# Patient Record
Sex: Male | Born: 1990 | Hispanic: No | Marital: Single | Smoking: Former smoker
Health system: Southern US, Community
[De-identification: ages and names within clinical notes are randomized; demographics above are authoritative.]

## PROBLEM LIST (undated history)

## (undated) DIAGNOSIS — F1910 Other psychoactive substance abuse, uncomplicated: Secondary | ICD-10-CM

## (undated) DIAGNOSIS — F419 Anxiety disorder, unspecified: Secondary | ICD-10-CM

## (undated) DIAGNOSIS — B1920 Unspecified viral hepatitis C without hepatic coma: Secondary | ICD-10-CM

## (undated) DIAGNOSIS — F329 Major depressive disorder, single episode, unspecified: Secondary | ICD-10-CM

## (undated) DIAGNOSIS — F32A Depression, unspecified: Secondary | ICD-10-CM

## (undated) HISTORY — PX: OTHER SURGICAL HISTORY: SHX169

---
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
## 2010-07-22 LAB — URINALYSIS, ROUTINE W REFLEX MICROSCOPIC
Bilirubin Urine: NEGATIVE
Glucose, UA: NEGATIVE mg/dL
Hgb urine dipstick: NEGATIVE
Ketones, ur: NEGATIVE mg/dL
Nitrite: NEGATIVE
Protein, ur: NEGATIVE mg/dL
Specific Gravity, Urine: 1.012 (ref 1.005–1.030)
Urobilinogen, UA: 1 mg/dL (ref 0.0–1.0)
pH: 8 (ref 5.0–8.0)

## 2010-08-27 NOTE — H&P (Signed)
[name], [name]                ACCOUNT NO.:  [id number]   MEDICAL RECORD NO.:  [id number]          PATIENT TYPE:  INP   LOCATION:  0203                          FACILITY:  [facility]   PHYSICIAN:  Lalla Brothers, MDDATE OF BIRTH:  06-26-1990   DATE OF ADMISSION:  09/28/2004  DATE OF DISCHARGE:                         PSYCHIATRIC ADMISSION ASSESSMENT   IDENTIFICATION:  This 20 year old male starting the ninth grade this fall at  [employer] is admitted emergently voluntarily in transfer  from [facility] pediatrics where it was inpatient  for stabilization of tachycardia associated with overdose suicide attempt.  The patient indicates a continued desire and intent to be dead to escape the  problems of his life, also indicating that he has substance abuse in this  regard too. He seems to identify with an aunt who he states became sober  from heroin when she knew she was going to die in order to die sober. Father  considers that the patient is disappointed in parental divorce when the  patient was age 4 and father's subsequent marriage. The patient has had  significant depression including reporting a 15-pound weight loss over the  last several months. The patient has diagnosed himself as bipolar disorder,  though he does not exhibit manic features. Still his reported of  conversation type auditory hallucinations is odd, particularly as responses  to these voices.   HISTORY OF PRESENT ILLNESS:  Containment for the patient is very difficult.  Father indicates the patient has outbursts of anger acting upon all these  problems as well as an intellectual sophistication being smarter than most  pharmacist about medications according to father. Father himself apparently  had substance abuse in the past but has 13 years of sobriety. Both parents  feel the patient is out of control, and they are having difficulty  facilitating the establishment of any  control. The patient is odd and  eccentric in his discussions and identifications. He has overdose 1 year ago  with [facility] as a suicide attempt. However, he also described using CCC,  snorting crushed Adderall, cannabis, alcohol and daily cigarettes to get  high and escape life. He was admitted to [facility] after reporting  overdosing with 30 white tablets he took from a friend's grandmother. He  suggests that these may be Xanax, though he also suggested there was a  mixture of anxiety and sleep medications. He also reported snorting crushed  Adderall before admission. He was concluded [facility] to have an  Adderall overdose due to his tachycardia evident on EKG as well. However,  his urine drug screen and his blood alcohol or negative even though he also  reported drinking alcohol with this overdose. The patient states that he is  in narcotic withdrawal and needs pain pills immediately. Father perceives  the patient is very depressed though the patient perceives himself as having  bipolar moods. The patient reports he has vague auditory hallucinations that  he answers, talking about what people sad and what music is on. The patient  has been suicidal now for the last  week. He has chronic disappointment and  dissatisfaction with life since parents divorced when he was age 27. He had  three psychotherapy sessions at [facility] in [place] or [place]. He is  on no medications of time of admission except for the Zantac and Bentyl  reportedly for irritable bowel.   PAST MEDICAL HISTORY:  The patient is under the primary care of [facility]. He reports a diagnosis of irritable bowel syndrome. He reports  that his appetite has been diminished several months and he has lost 15  pounds. He reports night sweats. He and father suggests that he had Rubella  in childhood though they may have meant chicken pox. His EKG was normal  except for sinus tachycardia at [facility]. His  random glucose was 111 and  repeated as it is CBG of 108. He had an elevated total bilirubin of 1.8. His  hemoglobin was high at 14.8 and MCHC at 35. He has no medication allergies.  He takes Zantac and Bentyl for irritable bowel symptoms though he may also  be describing dyspepsia or gastritis. He has no medication allergies. He had  no seizure or syncope. He had no heart murmur or arrhythmia.   REVIEW OF SYSTEMS:  The patient denies difficulty with gait, gaze or  continence. He denies exposure to communicable disease or toxins. He denies  rash, jaundice or purpura. There is no chest pain, palpitations or  presyncope. There is no abdominal pain, nausea, vomiting or diarrhea. There  is no dysuria or arthralgia.   IMMUNIZATIONS:  Up-to-date.   FAMILY HISTORY:  Father apparently had substance abuse disorder but is sober  for 13 years. The patient reports that an aunt was a heroin addict and  became sober just to die. Parents divorced when the patient was 3 years of  age, and father remarried a couple of years ago. Parents have joint custody  but neither feels like they can contain or help control the patient. They  have described the patient as very immature even though the patient thinks  himself as being overly mature.   SOCIAL AND DEVELOPMENTAL HISTORY:  The patient is starting the ninth grade  at [employer]. He has had academic performance problems in  the past. He was suspended twice last year for drug use. He acknowledges  using CCC, snorting crushed Adderall, using cannabis, using alcohol in the  daily use of cigarettes. He does not acknowledge sexual activity.   ASSETS:  The patient is intelligent.   MENTAL STATUS EXAM:  Height is 69 inches and weight is 124 pounds with blood  pressure 120/77, heart rate 134 sitting 123/75 with heart rate of 140  standing. He is left-handed. He is alert and oriented with speech intact. Cranial nerves II-XII intact. AMRs and  muscle strength and tone are normal.  Reflexes are normal, and there are no pathologic reflexes or soft neurologic  findings. Tandem gait and Romberg's are normal. Sensory exam is intact.  There are no abnormal involuntary movements. The patient seems to have  somewhat hostile dependent interpersonal style with schizotypal features. He  has severe dysphoria and seems hopeless, anorexic, has weight loss, and has  insomnia and fatigue. He does not exhibit manic symptoms. He reports vague  auditory hallucinations almost in a conversing style. He indicates that he  wants to be dead. He is morbidly fixated and seems to identify with aunt in  this way. He is asking for treatment for narcotic withdrawal an unusual  fashion. He expects his withdrawal to continue to get more painful even  though there are no physical manifestations of such on exam. He has  oppositional defiance and resistance. He is a significant suicide risk.   IMPRESSION:   AXIS I:  1.  Major depression, recurrent, severe.  2.  Oppositional defiant disorder.  3.  Psychoactive substance abuse not otherwise specified.  4.  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  5.  Parent-child problem.  6.  Other specified family circumstances.  7.  Noncompliance with treatment.   AXIS II:  Diagnosis deferred.   AXIS III:  1.  Mixed overdose apparently with Adderall and sedative/hypnotic.  2.  Irritable bowel syndrome and possible gastritis.  3.  Weight loss.   AXIS IV:  Stressors family severe acute and chronic; school moderate acute  and chronic; legal mild acute and chronic.   AXIS V:  Global assessment of function 30 with highest in last year 65.   PLAN:  The patient is admitted for inpatient adolescent psychiatric and  multidisciplinary multimodal behavioral health treatment in a team-based  programmatic locked psychiatric unit. The patient appears okay for Zantac  but will withhold Vistaril and  Bentyl considering his self-destructive and  addictive use of CCC. Remeron this started at 30 milligrams nightly.  Naltrexone may be helpful as well. Ongoing assessment is undertaken as  cognitive behavioral therapy starts. Anger management, communication and  social skills, substance abuse intervention, family therapy, identity  consolidation and object relations therapies can be undertaken. Estimated  length stay is 7 days with target symptoms for discharge being stabilization  of suicide risk and mood, stabilization of eccentric and somewhat psychotic  cognitions and perceptions and generalization of the capacity for safe  effective and sober participation in outpatient treatment.       GEJ/MEDQ  D:  09/29/2004  T:  09/29/2004  Job:  [id number]

## 2010-08-27 NOTE — Discharge Summary (Signed)
[name], [name]                ACCOUNT NO.:  [id number]   MEDICAL RECORD NO.:  [id number]          PATIENT TYPE:  INP   LOCATION:  0203                          FACILITY:  [facility]   PHYSICIAN:  Lalla Brothers, MDDATE OF BIRTH:  January 10, 1991   DATE OF ADMISSION:  09/28/2004  DATE OF DISCHARGE:  10/04/2004                                 DISCHARGE SUMMARY   IDENTIFICATION:  This 20 year-old male entering the ninth grade this fall at  [employer] was admitted emergently voluntarily in transfer  from [facility] pediatrics for inpatient  stabilization of tachycardia associated with overdose suicide attempt was  provided. The patient had continued intent to be dead and was fixated at the  time of arrival upon self-destructive activities such as addiction to  medication and various somatic concerns. The patient reported a 15-pound  weight loss over the last several months with significant depression that  father speculated with associated with parental divorce consequences. 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 For full details please see the typed admission assessment.   SYNOPSIS OF PRESENT ILLNESS:  The family notes outbursts of anger carried  out with an intellectual sophistication. Father has 13 years of sobriety and  thinks the patient is stressed by his remarriage. The patient is odd and  eccentric in his discussions identifications such as identifying with an  aunt, who died of consequences of heroin addiction but stopped using heroin  just before death. He was also concerned about needles that another aunt had  in her back for back problems. The patient had overdosed with sleeping  tablets he took from a friend's grandmother though he had also crushed  Adderall and snorted it. He acknowledged use of cannabis, alcohol  and  cigarettes to get high and escape life. He had three psychotherapy sessions  at [facility]. He was taking Zantac and Bentyl for 2 years for irritable bowel  under the primary care of [facility]. Parents have joint  custody. The patient thinks of himself as overly mature even though parents  perceived him as being very immature, particular currently. Father also  reported treatment with Wellbutrin himself in the past for depression with  mood swings. Paternal grandfather and paternal uncle had depression. There  is also a family history of diabetes and cancer as well as a paternal aunt  with seizures. The patient acknowledges some use of cocaine and triple C's  use and solicits Vistaril at the time of his arrival to the hospital.  He  uses Bentyl, triple C's and seeks other antihistamines as well.   INITIAL MENTAL STATUS EXAM:  The patient manifested hostile dependent  interpersonal style though with eccentric and odd communications and  identifications that seemed almost schizotypal. He had severe dysphoria and  seemed hopeless, irritable, anergic, and manifested insomnia and loss of  appetite. He did not a manifest manic diathesis. His vague auditory  hallucinations by history did not seem floridly psychotic  and seem likely to  represent a fusion of anxious and substance use symptoms. He reported that  he was in withdrawal from narcotics and needed immediate treatment coverage  for such. He was oppositional and defiant and presented significant suicide  risk.   LABORATORY FINDINGS:  At [facility], the patient's CBC was normal  except hemoglobin elevated at 14.8 with upper limit of normal 14.6 and MCHC  35 with upper limit of normal 34. White count was normal 6600, MCV at 86 and  platelet count 315,000 with  hematocrit 42.2. Monocyte differential was 10%  with upper limit of normal 9%. Comprehensive metabolic panel was normal  except random glucose 111 with total  bilirubin 1.8 with upper limit of  normal total bilirubin 1.2. Sodium was normal 140, potassium 3.7, CO2 25,  creatinine 0.8, calcium 9.3, albumin 4.3, AST 24 and ALT 15. Urine drug  screen at [facility] was negative despite the patient's reports of  overdose. Repeat capillary blood glucose was 108 just after midnight. At the  behavioral health center, free T4 was normal 1.01 and TSH at 2.484.  Urinalysis was normal with specific gravity slightly elevated at 1.031. RPR  was nonreactive. Urine probe for gonorrhea and chlamydia trichomatous by DNA  amplification were both negative. Electrocardiogram at [facility]  revealed sinus tachycardia with rate of 138, otherwise, normal with P-R of  132, QRS of 68 and QTc of 433 milliseconds.   HOSPITAL COURSE AND TREATMENT:  General medical exam by Mallie Darting PA-C  noted previous sutures and staples in his head and foot side in the past. He  reported the use of 5 or more cigarettes daily for the last 6 months. The  patient reported that grandmother, aunt and father had depression while  maternal grandmother and mother have anxiety. He had some acne that was  mild. He was Tanner stage IV. He reported a history of irritable bowel.  Admission height was 69 inches with weight of 124 pounds, and discharge  weight was 128 pounds. Blood pressure on admission was 120/77 with heart  rate of 134 sitting and 123/75 with heart rate of 140 standing. On the  second hospital day, supine blood pressure was 126/70 with heart rate of 87  and standing blood pressure 102 over 65 with heart rate of 153. Subsequently  his heart rate standing subsided to normal. By the time of discharge, supine  blood pressure was 103/59 with heart rate of 75 and standing blood pressure  100/55 with heart rate of 112. The patient was initially highly self-  defeating in treatment. He was started on Remeron 30 milligrams nightly and tolerated medication well. As anxiety  and depression gradually responded to  treatment, the patient's odd and eccentric identifications and symptoms  discussed remitted. He no longer talked about somatic fixations and fears or  identifications with drug addiction and demise. He was gradually able to  restore his desire to live. He improved communication with family and peers  and became a leader among peers in the treatment program. Coping skills  improved as did anger management and parents were pleased at the final  family therapy session. The patient required only several doses of Bentyl  during hospital stay p.r.n. with last dose on the 24th of June and only one  dose that day. He did receive his Zantac substitution throughout the  hospital stay. Both parents participated in the final family therapy  session. The patient made amends with the  family and plans for improved  communication, relations and activities in the future. He worked through  guilty ruminations that he had been storing up inside. He required no  seclusion or restraint during hospital stay. He participated in all  programmatic therapeutic activities including group, milieu, behavioral,  individual, family, special education, occupational, therapeutic  recreational, substance abuse intervention and anger management therapies.   FINAL DIAGNOSIS:   AXIS I:  1. Major depression, recurrent, severe.  2. Oppositional defiant disorder.  3. Generalized anxiety disorder.  4. Psychoactive substance abuse not otherwise specified.  5. Parent-child problem.  6. Other specified family circumstances.  7. Noncompliance with treatment.   AXIS II:  No diagnosis.   AXIS III:  1. Mixed overdose apparently with Adderall in sedative hypnotic.  2. Irritable bowel syndrome by history.  3. A 15-pound weight loss recently by history.  4. Cigarette smoking.   AXIS IV:  Stressors family severe, acute and chronic; school moderate acute  and chronic; legal mild acute and  chronic.   AXIS V:  Global assessment of function on admission 30 with highest in last  year 65 and discharge global assessment of function 56.   PLAN:  The patient was discharged to parents in improved condition free of  suicidal ideation. He was discharged on mirtazapine 30 milligrams every  bedtime, quantity #30 with one refill prescribed. They are educated on FDA  guidelines and on proper use and side effects. He continues his Zantac as  per own home supply, and they are  educated that Bentyl and triple C's are no longer necessary. He follows high-  fiber diet and has no restrictions on physical activity. Crisis and safety  plans are outlined if needed. He will see Terald Sleeper Ph.D. for therapy  October 06, 2004 at 1600. He will see Dr. Bayard Males for psychiatric follow-up  October 21, 2004 at 1700.       GEJ/MEDQ  D:  10/05/2004  T:  10/05/2004  Job:  [id number]   cc:   Bayard Males, MD  [facility]  [address] [place], [place]  EAV [id number] [phone]   Terald Sleeper, MD  [facility]  [address]  [place], [place]  NFA [phone]

## 2013-01-04 ENCOUNTER — Encounter (HOSPITAL_COMMUNITY): Payer: Self-pay | Admitting: Emergency Medicine

## 2013-01-04 ENCOUNTER — Inpatient Hospital Stay (HOSPITAL_COMMUNITY)
Admission: EM | Admit: 2013-01-04 | Discharge: 2013-01-11 | DRG: 897 | Disposition: A | Discharge status: Home / Self Care | Payer: 59 | Source: Intra-hospital | Attending: Psychiatry | Admitting: Psychiatry

## 2013-01-04 ENCOUNTER — Emergency Department (HOSPITAL_COMMUNITY)
Admission: EM | Admit: 2013-01-04 | Discharge: 2013-01-04 | Disposition: A | Discharge status: Other Acute Inpatient Hospital | Payer: 59 | Attending: Emergency Medicine | Admitting: Emergency Medicine

## 2013-01-04 ENCOUNTER — Encounter (HOSPITAL_COMMUNITY): Payer: Self-pay | Admitting: *Deleted

## 2013-01-04 DIAGNOSIS — Z5987 Material hardship due to limited financial resources, not elsewhere classified: Secondary | ICD-10-CM

## 2013-01-04 DIAGNOSIS — G4700 Insomnia, unspecified: Secondary | ICD-10-CM | POA: Insufficient documentation

## 2013-01-04 DIAGNOSIS — Z0289 Encounter for other administrative examinations: Secondary | ICD-10-CM | POA: Insufficient documentation

## 2013-01-04 DIAGNOSIS — F332 Major depressive disorder, recurrent severe without psychotic features: Secondary | ICD-10-CM

## 2013-01-04 DIAGNOSIS — F411 Generalized anxiety disorder: Secondary | ICD-10-CM

## 2013-01-04 DIAGNOSIS — F1910 Other psychoactive substance abuse, uncomplicated: Secondary | ICD-10-CM

## 2013-01-04 DIAGNOSIS — F1010 Alcohol abuse, uncomplicated: Secondary | ICD-10-CM | POA: Diagnosis present

## 2013-01-04 DIAGNOSIS — B1920 Unspecified viral hepatitis C without hepatic coma: Secondary | ICD-10-CM | POA: Diagnosis present

## 2013-01-04 DIAGNOSIS — F410 Panic disorder [episodic paroxysmal anxiety] without agoraphobia: Secondary | ICD-10-CM | POA: Diagnosis present

## 2013-01-04 DIAGNOSIS — F17200 Nicotine dependence, unspecified, uncomplicated: Secondary | ICD-10-CM | POA: Insufficient documentation

## 2013-01-04 DIAGNOSIS — F3289 Other specified depressive episodes: Secondary | ICD-10-CM | POA: Insufficient documentation

## 2013-01-04 DIAGNOSIS — R079 Chest pain, unspecified: Secondary | ICD-10-CM | POA: Insufficient documentation

## 2013-01-04 DIAGNOSIS — R45851 Suicidal ideations: Secondary | ICD-10-CM

## 2013-01-04 DIAGNOSIS — Z598 Other problems related to housing and economic circumstances: Secondary | ICD-10-CM

## 2013-01-04 DIAGNOSIS — F1020 Alcohol dependence, uncomplicated: Principal | ICD-10-CM | POA: Diagnosis present

## 2013-01-04 DIAGNOSIS — Z79899 Other long term (current) drug therapy: Secondary | ICD-10-CM

## 2013-01-04 DIAGNOSIS — F329 Major depressive disorder, single episode, unspecified: Secondary | ICD-10-CM

## 2013-01-04 DIAGNOSIS — F4310 Post-traumatic stress disorder, unspecified: Secondary | ICD-10-CM | POA: Diagnosis present

## 2013-01-04 HISTORY — DX: Major depressive disorder, single episode, unspecified: F32.9

## 2013-01-04 HISTORY — DX: Unspecified viral hepatitis C without hepatic coma: B19.20

## 2013-01-04 HISTORY — DX: Anxiety disorder, unspecified: F41.9

## 2013-01-04 HISTORY — DX: Depression, unspecified: F32.A

## 2013-01-04 LAB — COMPREHENSIVE METABOLIC PANEL
ALT: 23 U/L (ref 0–53)
Albumin: 3.6 g/dL (ref 3.5–5.2)
Alkaline Phosphatase: 46 U/L (ref 39–117)
BUN: 15 mg/dL (ref 6–23)
Potassium: 3.8 mEq/L (ref 3.5–5.1)
Sodium: 140 mEq/L (ref 135–145)
Total Protein: 6.5 g/dL (ref 6.0–8.3)

## 2013-01-04 LAB — RAPID URINE DRUG SCREEN, HOSP PERFORMED
Amphetamines: NOT DETECTED
Benzodiazepines: NOT DETECTED
Cocaine: POSITIVE — AB

## 2013-01-04 LAB — ETHANOL: Alcohol, Ethyl (B): <11 mg/dL (ref 0–11)

## 2013-01-04 LAB — CBC
MCHC: 34.9 g/dL (ref 30.0–36.0)
RDW: 12.4 % (ref 11.5–15.5)

## 2013-01-04 MED ORDER — BUSPIRONE HCL 5 MG PO TABS
10.0000 mg | ORAL_TABLET | Freq: Three times a day (TID) | ORAL | Status: DC
  Administered 2013-01-04 – 2013-01-11 (×21): 10 mg via ORAL
  Filled 2013-01-04 (×7): qty 1
  Filled 2013-01-04: qty 2
  Filled 2013-01-04 (×16): qty 1

## 2013-01-04 MED ORDER — HYDROXYZINE HCL 25 MG PO TABS
25.0000 mg | ORAL_TABLET | Freq: Four times a day (QID) | ORAL | Status: AC | PRN
  Administered 2013-01-06 – 2013-01-07 (×2): 25 mg via ORAL
  Filled 2013-01-04 (×2): qty 1

## 2013-01-04 MED ORDER — NICOTINE 21 MG/24HR TD PT24: 21.0000 mg | MEDICATED_PATCH | Freq: Every day | TRANSDERMAL | Status: DC

## 2013-01-04 MED ORDER — AMOXICILLIN-POT CLAVULANATE 875-125 MG PO TABS
1.0000 | ORAL_TABLET | Freq: Two times a day (BID) | ORAL | Status: AC
  Administered 2013-01-04 – 2013-01-08 (×8): 1 via ORAL
  Filled 2013-01-04 (×9): qty 1

## 2013-01-04 MED ORDER — TRAZODONE HCL 50 MG PO TABS
50.0000 mg | ORAL_TABLET | Freq: Every evening | ORAL | Status: DC | PRN
  Administered 2013-01-04 – 2013-01-06 (×3): 50 mg via ORAL
  Filled 2013-01-04 (×3): qty 1

## 2013-01-04 MED ORDER — PAROXETINE HCL 20 MG PO TABS
20.0000 mg | ORAL_TABLET | Freq: Every day | ORAL | Status: DC
  Administered 2013-01-04 – 2013-01-07 (×4): 20 mg via ORAL
  Filled 2013-01-04 (×6): qty 1

## 2013-01-04 MED ORDER — IBUPROFEN 200 MG PO TABS: 600.0000 mg | ORAL_TABLET | Freq: Three times a day (TID) | ORAL | Status: DC | PRN

## 2013-01-04 MED ORDER — ONDANSETRON 4 MG PO TBDP
4.0000 mg | ORAL_TABLET | Freq: Four times a day (QID) | ORAL | Status: DC | PRN
  Administered 2013-01-04: 4 mg via ORAL

## 2013-01-04 MED ORDER — CHLORDIAZEPOXIDE HCL 25 MG PO CAPS
25.0000 mg | ORAL_CAPSULE | Freq: Four times a day (QID) | ORAL | Status: AC
  Administered 2013-01-04 – 2013-01-05 (×6): 25 mg via ORAL
  Filled 2013-01-04 (×6): qty 1

## 2013-01-04 MED ORDER — ONDANSETRON HCL 4 MG PO TABS: 4.0000 mg | ORAL_TABLET | Freq: Three times a day (TID) | ORAL | Status: DC | PRN

## 2013-01-04 MED ORDER — CHLORDIAZEPOXIDE HCL 25 MG PO CAPS
25.0000 mg | ORAL_CAPSULE | Freq: Every day | ORAL | Status: AC
  Administered 2013-01-08: 25 mg via ORAL
  Filled 2013-01-04: qty 1

## 2013-01-04 MED ORDER — CHLORDIAZEPOXIDE HCL 25 MG PO CAPS
25.0000 mg | ORAL_CAPSULE | ORAL | Status: AC
  Administered 2013-01-07 (×2): 25 mg via ORAL
  Filled 2013-01-04 (×2): qty 1

## 2013-01-04 MED ORDER — ADULT MULTIVITAMIN W/MINERALS CH
1.0000 | ORAL_TABLET | Freq: Every day | ORAL | Status: DC
  Administered 2013-01-04 – 2013-01-11 (×8): 1 via ORAL
  Filled 2013-01-04 (×9): qty 1

## 2013-01-04 MED ORDER — CHLORDIAZEPOXIDE HCL 25 MG PO CAPS
25.0000 mg | ORAL_CAPSULE | Freq: Four times a day (QID) | ORAL | Status: AC | PRN
  Administered 2013-01-04 – 2013-01-06 (×4): 25 mg via ORAL
  Filled 2013-01-04 (×4): qty 1

## 2013-01-04 MED ORDER — ALUM & MAG HYDROXIDE-SIMETH 200-200-20 MG/5ML PO SUSP: 30.0000 mL | ORAL | Status: DC | PRN

## 2013-01-04 MED ORDER — MAGNESIUM HYDROXIDE 400 MG/5ML PO SUSP: 30.0000 mL | Freq: Every day | ORAL | Status: DC | PRN

## 2013-01-04 MED ORDER — ACETAMINOPHEN 325 MG PO TABS: 650.0000 mg | ORAL_TABLET | Freq: Four times a day (QID) | ORAL | Status: DC | PRN

## 2013-01-04 MED ORDER — LOPERAMIDE HCL 2 MG PO CAPS: 2.0000 mg | ORAL_CAPSULE | ORAL | Status: AC | PRN

## 2013-01-04 MED ORDER — LORAZEPAM 1 MG PO TABS: 1.0000 mg | ORAL_TABLET | Freq: Three times a day (TID) | ORAL | Status: DC | PRN

## 2013-01-04 MED ORDER — VITAMIN B-1 100 MG PO TABS
100.0000 mg | ORAL_TABLET | Freq: Every day | ORAL | Status: DC
  Administered 2013-01-05 – 2013-01-11 (×7): 100 mg via ORAL
  Filled 2013-01-04 (×8): qty 1

## 2013-01-04 MED ORDER — CHLORDIAZEPOXIDE HCL 25 MG PO CAPS
25.0000 mg | ORAL_CAPSULE | Freq: Three times a day (TID) | ORAL | Status: AC
  Administered 2013-01-06 (×3): 25 mg via ORAL
  Filled 2013-01-04 (×3): qty 1
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
## 2013-01-04 NOTE — ED Notes (Signed)
Pt transferred from triage, presents with SI, plan to drink bleach.  States he has no health insurance and his appt was pushed back 1 month, felt SI.  Reports he took pills 8 yrs ago.  Denies HI or AV hallucinations, diag. With Anxiety,Panic DO.  Admits to drinking 8 beers/day, cocaine & marijuana use.  Feeling hopeless.  Pt calm & cooperative.

## 2013-01-04 NOTE — ED Provider Notes (Signed)
Medical screening examination/treatment/procedure(s) were performed by non-physician practitioner and as supervising physician I was immediately available for consultation/collaboration.    Celene Kras, MD 01/04/13 [phone]

## 2013-01-04 NOTE — BHH Group Notes (Signed)
Adult Psychoeducational Group Note  Date:  01/04/2013 Time:  10:02 PM  Group Topic/Focus:  AA Meeting  Participation Level:  Active  Participation Quality:  Appropriate  Affect:  Appropriate  Cognitive:  Appropriate  Insight: Appropriate  Engagement in Group:  Engaged  Modes of Intervention:  Discussion and Education  Additional Comments:  Kamali attended group.  Caroll Rancher A 01/04/2013, 10:02 PM
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
## 2013-01-04 NOTE — ED Notes (Signed)
Pt states for the past couple of months he has had thoughts of hurting himself but over the past week it has gotten worse to the point that it is scaring him   Pt states he called [facility] and was told to come here  Pt states he has tried to get help on his own but because he does not have medical insurance no one will see him

## 2013-01-04 NOTE — Tx Team (Signed)
Initial Interdisciplinary Treatment Plan  PATIENT STRENGTHS: (choose at least two) Ability for insight Active sense of humor Average or above average intelligence Capable of independent living Communication skills General fund of knowledge  PATIENT STRESSORS: Educational concerns Financial difficulties Health problems Marital or family conflict Substance abuse   PROBLEM LIST: Problem List/Patient Goals Date to be addressed Date deferred Reason deferred Estimated date of resolution  Alcohol Abuse 01/04/13     depression 01/04/13      Generalized Anxiety 01/04/13     Tonsil nfection 01/04/13                                    DISCHARGE CRITERIA:  Ability to meet basic life and health needs Adequate post-discharge living arrangements Improved stabilization in mood, thinking, and/or behavior Medical problems require only outpatient monitoring Motivation to continue treatment in a less acute level of care Need for constant or close observation no longer present  PRELIMINARY DISCHARGE PLAN: Attend aftercare/continuing care group Attend PHP/IOP Attend 12-step recovery group Outpatient therapy  PATIENT/FAMIILY INVOLVEMENT: Rich Brave 01/04/2013, 5:09 PM

## 2013-01-04 NOTE — ED Notes (Signed)
Pt has a suicide attempt in the past at age 22

## 2013-01-04 NOTE — ED Provider Notes (Addendum)
CSN: [id number]     Arrival date & time 01/04/13  0028 History   First MD Initiated Contact with Patient 01/04/13 0103     Chief Complaint  Patient presents with  . Medical Clearance   (Consider location/radiation/quality/duration/timing/severity/associated sxs/prior Treatment) HPI Comments: This and states, that he had a suicide attempt at age 22.  He was sent to a residential pediatric psychiatric facility for a week or 2 discharged on medication, which he took for another week or 2.  Didn't like the way.  It makes felt so.  He hasn't taken any medication.  Since that this is been tolerating his wife until about 4 months ago, when something changed any of his daily.  Severe anxiety is not sleeping.  He is losing weight.  He's resorted to alcohol use on a daily basis.  Occasional marijuana, when he is in social situations.  He denies suicidality, but he states, that he would rather go to sleep, and never wake up  The history is provided by the patient.    Past Medical History  Diagnosis Date  . Hepatitis C   . Depression   . Anxiety    Past Surgical History  Procedure Laterality Date  . Extraction of wisdom teeth     History reviewed. No pertinent family history. History  Substance Use Topics  . Smoking status: Current Every Day Smoker -- 1.00 packs/day    Types: Cigarettes  . Smokeless tobacco: Not on file  . Alcohol Use: 3.6 oz/week    6 Glasses of wine, 7-10 Cans of beer per week     Comment: 4 to 5 times a week     Review of Systems  Constitutional: Negative for fever.  HENT: Negative for rhinorrhea.   Respiratory: Negative for shortness of breath.   Cardiovascular: Positive for chest pain.  Neurological: Negative for dizziness and headaches.  Psychiatric/Behavioral: Positive for sleep disturbance. Negative for suicidal ideas and self-injury. The patient is nervous/anxious.   All other systems reviewed and are negative.    Allergies  Review of patient's allergies  indicates no known allergies.  Home Medications  No current outpatient prescriptions on file. BP 114/77  Pulse 79  Temp(Src) 98.6 F (37 C) (Oral)  Resp 16  Ht 6' (1.829 m)  Wt 127 lb (57.607 kg)  BMI 17.22 kg/m2  SpO2 100% Physical Exam  Nursing note and vitals reviewed. Constitutional: He appears well-developed and well-nourished.  HENT:  Head: Normocephalic.  Eyes: Pupils are equal, round, and reactive to light.  Cardiovascular: Normal rate and regular rhythm.   Pulmonary/Chest: Effort normal.  Musculoskeletal: Normal range of motion.  Neurological: He is alert.  Skin: Skin is warm and dry. No rash noted.  Psychiatric: His speech is normal. His mood appears anxious. Cognition and memory are normal. He expresses inappropriate judgment. He exhibits a depressed mood. He expresses suicidal ideation. He expresses no suicidal plans.    ED Course  Procedures (including critical care time) Labs Review Labs Reviewed  URINE RAPID DRUG SCREEN (HOSP PERFORMED) - Abnormal; Notable for the following:    Cocaine POSITIVE (*)    Tetrahydrocannabinol POSITIVE (*)    All other components within normal limits  CBC  COMPREHENSIVE METABOLIC PANEL  ETHANOL   Imaging Review No results found.  MDM   1. Depression   2. Substance abuse     Will have patient evaluated by psychiatry Labs reviewed all within normal parameters.  Patient.  Stretching is positive for cocaine, and marijuana  Arman Filter, NP 01/04/13 0129  Arman Filter, NP 01/04/13 2049

## 2013-01-04 NOTE — ED Notes (Addendum)
Pt is awake and alert, pleasant and cooperative. Patient denies HI, SI AH or VH.pt is reporting passive SI. Discharge vitals 115/78 HR 77 RR 16 and unlabored. Pt has inpatient treatment scheduled at [facility]. Will continue to monitor for safety. Patient escorted to lobby without incident. T.Melvyn Neth RN
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
## 2013-01-04 NOTE — ED Provider Notes (Signed)
Accepted at [facility], patient agrees to transfer  Wayne Horn, MD 01/05/13 [phone]

## 2013-01-05 ENCOUNTER — Encounter (HOSPITAL_COMMUNITY): Payer: Self-pay | Admitting: Physician Assistant

## 2013-01-05 DIAGNOSIS — F1910 Other psychoactive substance abuse, uncomplicated: Secondary | ICD-10-CM

## 2013-01-05 DIAGNOSIS — F332 Major depressive disorder, recurrent severe without psychotic features: Secondary | ICD-10-CM

## 2013-01-05 DIAGNOSIS — F1010 Alcohol abuse, uncomplicated: Secondary | ICD-10-CM

## 2013-01-05 MED ORDER — NICOTINE 14 MG/24HR TD PT24
14.0000 mg | MEDICATED_PATCH | Freq: Every day | TRANSDERMAL | Status: DC
  Administered 2013-01-05 – 2013-01-11 (×7): 14 mg via TRANSDERMAL
  Filled 2013-01-05 (×9): qty 1

## 2013-01-05 MED ORDER — MIRTAZAPINE 15 MG PO TABS
15.0000 mg | ORAL_TABLET | Freq: Every day | ORAL | Status: DC
  Administered 2013-01-05: 15 mg via ORAL
  Filled 2013-01-05 (×3): qty 1

## 2013-01-05 MED ORDER — PHENAZOPYRIDINE HCL 200 MG PO TABS
200.0000 mg | ORAL_TABLET | Freq: Three times a day (TID) | ORAL | Status: AC
Start: 2013-01-05 — End: 2013-01-07
  Administered 2013-01-05 – 2013-01-07 (×6): 200 mg via ORAL
  Filled 2013-01-05 (×6): qty 1

## 2013-01-05 NOTE — Progress Notes (Signed)
Patient ID: [name], male   DOB: 1990/05/21, 22 y.o.   MRN: [id number] D)  Attended group this evening, stated he had never been to an AA meeting and had enjoyed it, was interested in what was said and was learning.  Stated was having some nausea and anxiety, went back to his room and spent most of the rest of the evening in his room reading.  Has been pleasant and cooperative, compliant with meds, program.  A)  Will continue to monitor for safety, continue POC R)  Safety maintained.

## 2013-01-05 NOTE — [facility] Group Notes (Signed)
[facility] Group Notes:  (Nursing/MHT/Case Management/Adjunct)  Date:  01/05/2013  Time:  3:27 PM  Type of Therapy:  Psychoeducational Skills  Participation Level:  Active  Participation Quality:  Appropriate  Affect:  Appropriate  Cognitive:  Appropriate  Insight:  Appropriate  Engagement in Group:  Engaged  Modes of Intervention:  Problem-solving  Summary of Progress/Problems: Pt attended healthy coping skills group, and was able to engage in treatment.   Jacquelyne Balint Shanta 01/05/2013, 3:27 PM

## 2013-01-05 NOTE — [facility] Group Notes (Signed)
[facility] Group Notes: (Clinical Social Work)   01/05/2013      Type of Therapy:  Group Therapy   Participation Level:  Did Not Attend - He did arrive for last 5 minutes of group and identified himself as being in the Preparation Stage of Change.  Stated he is preparing first by being here at [facility].  Then he wants to find a different place to live.   Ambrose Mantle, LCSW 01/05/2013, 1:10 PM
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
## 2013-01-05 NOTE — Progress Notes (Signed)
Psychoeducational Group Note  Date:  01/05/2013 Time: 1300  Group Topic/Focus:  Identifying Needs:   The focus of this group is to help patients identify their personal needs that have been historically problematic and identify healthy behaviors to address their needs.  Participation Level:  Active  Participation Quality:  Appropriate  Affect:  Appropriate  Cognitive:  Oriented  Insight:  Limited  Engagement in Group:  Engaged  Additional Comments:    01/05/2013,4:40 PM Analysa Nutting, Joie Bimler
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
## 2013-01-05 NOTE — Progress Notes (Signed)
D Kelsey has continued with his alcohol detox today, tolerating it well. HE has drank ample amounts of gatorade. HE tolerates his medications well and he has a much better appetite today, sharing that he has actually eaten "most " of his lunch and dinner today.   A He was started on pyridium, for c/o burning and urgency on urination. He tolerates this new medication without diff. HE he has been ordered remeron, for bedtime. HE is engaged in his poc and is actively trying to learn healthier coping skills.
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
## 2013-01-06 MED ORDER — ONDANSETRON HCL 4 MG PO TABS
4.0000 mg | ORAL_TABLET | Freq: Four times a day (QID) | ORAL | Status: AC | PRN
  Administered 2013-01-06: 4 mg via ORAL
  Filled 2013-01-06: qty 1

## 2013-01-06 MED ORDER — ENSURE COMPLETE PO LIQD
237.0000 mL | Freq: Three times a day (TID) | ORAL | Status: DC
  Administered 2013-01-06 – 2013-01-11 (×15): 237 mL via ORAL

## 2013-01-06 MED ORDER — MIRTAZAPINE 30 MG PO TABS
30.0000 mg | ORAL_TABLET | Freq: Every day | ORAL | Status: DC
  Administered 2013-01-06: 30 mg via ORAL
  Filled 2013-01-06 (×2): qty 1

## 2013-01-06 NOTE — Progress Notes (Signed)
[facility] MD Progress Note  01/06/2013 11:20 AM [name]  MRN:  [id number] Subjective:  [name] is lying in bed this morning, and does not open his eyes or move to speak with this provider. He reports that he did not sleep well last night although he took both Remeron and trazodone. He endorses cravings for alcohol, and states he is having mild tremors. He rates his depression as a 7 on the scale of 1-10 where 10 is the worst. He rates his anxiety as an 8/10. He denies any suicidal or homicidal ideations. He denies any auditory or visual hallucinations. 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  Hepatitis C    .  Depression    .  Anxiety    AXIS IV: economic problems, educational problems, housing problems, occupational problems, other psychosocial or environmental problems, problems related to legal system/crime, problems related to social environment and problems with primary support group  AXIS V: 21-30 behavior considerably influenced by delusions or hallucinations OR serious impairment in judgment, communication OR inability to function in almost all areas  ADL's:  Intact  Sleep: Poor  Appetite:  Good  Suicidal Ideation:  Patient denies any thoughts, plan, or intent Homicidal Ideation:  Patient denies any thoughts, plan, or intent AEB (as evidenced by):  Psychiatric Specialty Exam: Review of Systems  Constitutional: Negative.   HENT: Negative.   Eyes: Negative.    Respiratory: Negative.   Cardiovascular: Negative.   Gastrointestinal: Negative.   Musculoskeletal: Negative.   Skin: Negative.   Neurological: Positive for tremors.  Endo/Heme/Allergies: Negative.   Psychiatric/Behavioral: Positive for depression and substance abuse. Negative for suicidal ideas and hallucinations. The patient is nervous/anxious and has insomnia.     Blood pressure 126/82, pulse 103, temperature 97.6 F (36.4 C), temperature source Oral, resp. rate 22, height 6\' 6"  (1.981 m), weight 57.153 kg (126 lb), SpO2 97.00%.Body mass index is 14.56 kg/(m^2).  General Appearance: Disheveled  Eye Contact::  Absent  Speech:  Clear and Coherent  Volume:  Decreased  Mood:  Depressed  Affect:  Congruent  Thought Process:  Logical  Orientation:  Full (Time, Place, and Person)  Thought Content:  WDL  Suicidal Thoughts:  No  Homicidal Thoughts:  No  Memory:  Immediate;   Fair Recent;   Fair Remote;   Fair  Judgement:  Impaired  Insight:  Lacking  Psychomotor Activity:  Decreased  Concentration:  Good  Recall:  Good  Akathisia:  No  Handed:    AIMS (if indicated):     Assets:  Communication Skills Desire for Improvement  Sleep:  Number of Hours: 5   Current Medications: Current Facility-Administered Medications  Medication Dose Route Frequency Provider Last Rate Last Dose  . acetaminophen (TYLENOL) tablet 650 mg  650 mg Oral Q6H PRN Earney Navy, NP      . alum & mag hydroxide-simeth (MAALOX/MYLANTA) 200-200-20 MG/5ML suspension 30 mL  30 mL Oral Q4H PRN Earney Navy, NP      . amoxicillin-clavulanate (AUGMENTIN) 875-125 MG per tablet 1 tablet  1  tablet Oral Q12H Verne Spurr, PA-C   1 tablet at 01/06/13 4098  . busPIRone (BUSPAR) tablet 10 mg  10 mg Oral TID Earney Navy, NP   10 mg at 01/06/13 0752  . chlordiazePOXIDE (LIBRIUM) capsule 25 mg  25 mg Oral Q6H PRN Fransisca Kaufmann, NP   25 mg at 01/05/13 1029  . chlordiazePOXIDE (LIBRIUM) capsule 25 mg  25 mg  Oral TID Fransisca Kaufmann, NP   25 mg at 01/06/13 0752   Followed by  . [START ON 01/07/2013] chlordiazePOXIDE (LIBRIUM) capsule 25 mg  25 mg Oral BH-qamhs Fransisca Kaufmann, NP       Followed by  . [START ON 01/08/2013] chlordiazePOXIDE (LIBRIUM) capsule 25 mg  25 mg Oral Daily Fransisca Kaufmann, NP      . hydrOXYzine (ATARAX/VISTARIL) tablet 25 mg  25 mg Oral Q6H PRN Fransisca Kaufmann, NP      . loperamide (IMODIUM) capsule 2-4 mg  2-4 mg Oral PRN Fransisca Kaufmann, NP      . magnesium hydroxide (MILK OF MAGNESIA) suspension 30 mL  30 mL Oral Daily PRN Earney Navy, NP      . mirtazapine (REMERON) tablet 15 mg  15 mg Oral QHS Jorje Guild, PA-C   15 mg at 01/05/13 2200  . multivitamin with minerals tablet 1 tablet  1 tablet Oral Daily Fransisca Kaufmann, NP   1 tablet at 01/06/13 0752  . nicotine (NICODERM CQ - dosed in mg/24 hours) patch 14 mg  14 mg Transdermal Q0600 Rachael Fee, MD   14 mg at 01/06/13 0751  . ondansetron (ZOFRAN) tablet 4 mg  4 mg Oral Q6H PRN Rachael Fee, MD      . PARoxetine (PAXIL) tablet 20 mg  20 mg Oral Daily Earney Navy, NP   20 mg at 01/06/13 0752  . phenazopyridine (PYRIDIUM) tablet 200 mg  200 mg Oral TID WC Jorje Guild, PA-C   200 mg at 01/06/13 0752  . thiamine (VITAMIN B-1) tablet 100 mg  100 mg Oral Daily Fransisca Kaufmann, NP   100 mg at 01/06/13 0752  . traZODone (DESYREL) tablet 50 mg  50 mg Oral QHS PRN,MR X 1 Earney Navy, NP   50 mg at 01/05/13 2200    Lab Results: No results found for this or any previous visit (from the past 48 hour(s)).  Physical Findings: AIMS: Facial and Oral Movements Muscles of Facial Expression: None, normal Lips and Perioral Area: None, normal Jaw: None, normal Tongue: None, normal,Extremity Movements Upper (arms, wrists, hands, fingers): None, normal Lower (legs, knees, ankles, toes): None, normal, Trunk Movements Neck, shoulders, hips: None, normal, Overall Severity Severity of abnormal movements (highest score from questions above): None,  normal Incapacitation due to abnormal movements: None, normal Patient's awareness of abnormal movements (rate only patient's report): No Awareness, Dental Status Current problems with teeth and/or dentures?: No Does patient usually wear dentures?: No  CIWA:  CIWA-Ar Total: 4 COWS:  COWS Total Score: 7  Treatment Plan Summary: Daily contact with patient to assess and evaluate symptoms and progress in treatment Medication management  Plan: We will increase his Remeron to 30 mg. Otherwise we will continue the safe medical detox, and research options for followup care. Medical Decision Making Problem Points:  Established problem, stable/improving (1) and Review of psycho-social stressors (1) Data Points:  Review or order clinical lab tests (1) Review of new medications or change in dosage (2)  I certify that inpatient services furnished can reasonably  be expected to improve the patient's condition.   WATT,ALAN 01/06/2013, 11:20 AM  Discussed with provider. Reviewed note. Agree with above findings, assessment and plan.   Jacqulyn Cane, M.D.  01/06/2013 11:41 PM

## 2013-01-06 NOTE — [facility] Group Notes (Signed)
[facility] Group Notes: (Clinical Social Work)   01/06/2013      Type of Therapy:  Group Therapy   Participation Level:  Did Not Attend    Ambrose Mantle, LCSW 01/06/2013, 12:39 PM

## 2013-01-06 NOTE — Progress Notes (Signed)
Nutrition Brief Note Patient identified on the Malnutrition Screening Tool (MST) Report.  Wt Readings from Last 10 Encounters:  01/04/13 126 lb (57.153 kg)  01/04/13 127 lb (57.607 kg)   Body Mass Index is 17.3 Patient meets criteria for Underweight based on current BMI.   Discussed intake PTA with patient and compared to intake presently.  Discussed changes in intake, if any, and encouraged adequate intake of meals and snacks. Current diet order is regular and pt is also offered choice of unit snacks mid-morning and mid-afternoon.  Pt is eating as desired.   Labs and medications reviewed.   Pt reports that he does not eat well because he has severe anxiety that makes him sick to his stomach and unable to eat. Pt has experienced "at least a 30 lb wt loss."   Nutrition Dx:  Unintended wt change r/t suboptimal oral intake AEB pt report  Interventions:   Discussed the importance of nutrition and encouraged intake of food and beverages.     Encouraged small frequent high-calorie, high-protein meals.   Discussed weight goals with patient.   Supplements: Ensure Complete po TID, each supplement provides 350 kcal and 13 grams of protein.    No additional nutrition interventions warranted at this time. If nutrition issues arise, please consult RD.   Ebbie Latus RD, LDN

## 2013-01-06 NOTE — Progress Notes (Signed)
Patient ID: [name], male   DOB: [birth year], 22 y.o.   MRN: [id number] D)  Came to the med window this evening, stated still feels anxious frequently, feels like heart is skipping beats sometimes, and feels SOB when this happens, which adds to his anxiety.  States had an EKG for a facility and was told it was abnormal, but was never told what the abnormality was. States doesn't have insurance, so he doesn't think he can have himself evaluated.  Wondered if it was something that could be arranged, encouraged him to talk with MD.  Has been attending group, pleasant, compliant, feeling better in regard to w/d sx.  A)  Will continue to monitor for safety, continue POC R)  Safety maintained.

## 2013-01-06 NOTE — Progress Notes (Signed)
Patient ID: [name], male   DOB: [birth year], 22 y.o.   MRN: [id number] D)  Came to the med window this evening, states feeling a little better, throat still sore, a little tired, but feels is doing better.  States continues to have anxiety, and episodes where he feels his heart is racing, HR had gone into 150's with anxiety episode earlier today. Attended group tonight, co,pliant with meds, pleasant, cooperative.  A)  Will continue to monitor for safety, continue POC R)  Safety maintained.

## 2013-01-07 DIAGNOSIS — F4310 Post-traumatic stress disorder, unspecified: Secondary | ICD-10-CM | POA: Diagnosis present

## 2013-01-07 DIAGNOSIS — F1010 Alcohol abuse, uncomplicated: Secondary | ICD-10-CM | POA: Diagnosis present

## 2013-01-07 DIAGNOSIS — F410 Panic disorder [episodic paroxysmal anxiety] without agoraphobia: Secondary | ICD-10-CM | POA: Diagnosis present

## 2013-01-07 DIAGNOSIS — F39 Unspecified mood [affective] disorder: Secondary | ICD-10-CM

## 2013-01-07 MED ORDER — PROPRANOLOL HCL 10 MG PO TABS
10.0000 mg | ORAL_TABLET | Freq: Two times a day (BID) | ORAL | Status: DC
  Filled 2013-01-07 (×2): qty 1

## 2013-01-07 MED ORDER — PROPRANOLOL HCL 10 MG PO TABS
10.0000 mg | ORAL_TABLET | Freq: Two times a day (BID) | ORAL | Status: DC
  Administered 2013-01-07 – 2013-01-11 (×8): 10 mg via ORAL
  Filled 2013-01-07 (×12): qty 1

## 2013-01-07 MED ORDER — GABAPENTIN 100 MG PO CAPS
100.0000 mg | ORAL_CAPSULE | Freq: Once | ORAL | Status: AC
  Administered 2013-01-07: 100 mg via ORAL
  Filled 2013-01-07: qty 1

## 2013-01-07 MED ORDER — ZOLPIDEM TARTRATE 10 MG PO TABS
10.0000 mg | ORAL_TABLET | Freq: Every evening | ORAL | Status: DC | PRN
  Administered 2013-01-07: 10 mg via ORAL
  Filled 2013-01-07 (×2): qty 1

## 2013-01-07 MED ORDER — GABAPENTIN 100 MG PO CAPS
200.0000 mg | ORAL_CAPSULE | Freq: Three times a day (TID) | ORAL | Status: DC
  Administered 2013-01-07 – 2013-01-08 (×2): 200 mg via ORAL
  Filled 2013-01-07 (×5): qty 2

## 2013-01-07 MED ORDER — HYDROXYZINE HCL 50 MG PO TABS: 50.0000 mg | ORAL_TABLET | ORAL | Status: DC | PRN

## 2013-01-07 MED ORDER — GABAPENTIN 100 MG PO CAPS
100.0000 mg | ORAL_CAPSULE | Freq: Three times a day (TID) | ORAL | Status: DC
  Filled 2013-01-07: qty 1

## 2013-01-07 MED ORDER — GABAPENTIN 100 MG PO CAPS
100.0000 mg | ORAL_CAPSULE | Freq: Three times a day (TID) | ORAL | Status: DC
  Administered 2013-01-07: 100 mg via ORAL
  Filled 2013-01-07 (×7): qty 1

## 2013-01-07 MED ORDER — HYDROXYZINE HCL 50 MG PO TABS
50.0000 mg | ORAL_TABLET | Freq: Once | ORAL | Status: AC
  Administered 2013-01-07: 50 mg via ORAL
  Filled 2013-01-07 (×2): qty 1

## 2013-01-07 NOTE — Progress Notes (Signed)
D:Pt's pulse has been increased during the day. Pt reports heart racing, throat tightness, and not being able to sit still. Reported to NP and pt was evaluated. New orders were written. Pt reports that these symptoms happen at home with increased anxiety and no probable cause. A:Offered support, encouragement and 15 minute checks. R:Pt denies si and hi. Pt is using deep breathing to decrease anxiety. Safety maintained on the unit.

## 2013-01-07 NOTE — Tx Team (Signed)
Interdisciplinary Treatment Plan Update (Adult)  Date: 01/07/2013  Time Reviewed:  9:45 AM  Progress in Treatment: Attending groups: Yes Participating in groups:  Yes Taking medication as prescribed:  Yes Tolerating medication:  Yes Family/Significant othe contact made: CSW assessing Patient understands diagnosis:  Yes Discussing patient identified problems/goals with staff:  Yes Medical problems stabilized or resolved:  Yes Denies suicidal/homicidal ideation: Yes Issues/concerns per patient self-inventory:  Yes Other:  New problem(s) identified: N/A  Discharge Plan or Barriers: CSW assessing for appropriate referrals.    Reason for Continuation of Hospitalization: Anxiety Depression Detox Medication Stabilization  Comments: N/A  Estimated length of stay: 3-5 days  For review of initial/current patient goals, please see plan of care.  Attendees: Patient:     Family:     Physician:  Dr. Lugo 01/07/2013 10:46 AM   Nursing:   Andrea Thorne, RN 01/07/2013 10:46 AM   Clinical Social Worker:  Rynlee Lisbon Horton, LCSWA 01/07/2013 10:46 AM   Other: Jennifer Clark, RN case manager 01/07/2013 10:46 AM   Other:  Heather Smart, LCSWA 01/07/2013 10:46 AM   Other:  Aggie Nwoko, NP 01/07/2013 10:46 AM   Other:     Other:    Other:    Other:    Other:    Other:    Other:     Scribe for Treatment Team:   Horton, Arshia Rondon Nicole, 01/07/2013 , 10:46 AM     

## 2013-01-07 NOTE — ED Provider Notes (Signed)
Medical screening examination/treatment/procedure(s) were performed by non-physician practitioner and as supervising physician I was immediately available for consultation/collaboration.    Aminat Shelburne R Breon Diss, MD 01/07/13 1018 

## 2013-01-07 NOTE — Progress Notes (Addendum)
[facility] MD Progress Note  01/07/2013 5:57 PM [name]  MRN:  [id number] Subjective:  States he experiences severe anxiety, panic attacks. States that the anxiety has caused his jobs, as well as kept him form being able to go back  To school. States he had a problem with opioids but endorsed he is not anymore. Any substance use at this time states is an attempt to deal with hs anxiety. It got to a point he was wanting to kills himself. States he stays at home has no life. Diagnosis:  Sates that since he was started on the Paxil he feels more agitated, having restless legs DSM5: Schizophrenia Disorders:   Obsessive-Compulsive Disorders:   Trauma-Stressor Disorders:  Posttraumatic Stress Disorder (309.81) Substance/Addictive Disorders:  Alcohol Use Disoreder Depressive Disorders:  Major Depressive Disorder - Moderate (296.22)  Axis I: Mood Disorder NOS and Panic Disorder  ADL's:  Intact  Sleep: Poor  Appetite:  Poor  Suicidal Ideation:  Plan:  denies Intent:  denies Means:  denies Homicidal Ideation:  Plan:  denies Intent:  denies Means:  denies AEB (as evidenced by):  Psychiatric Specialty Exam: Review of Systems  Constitutional: Negative.   HENT: Negative.   Eyes: Negative.   Respiratory: Negative.   Cardiovascular: Negative.   Gastrointestinal: Negative.   Genitourinary: Negative.   Musculoskeletal: Negative.   Skin: Negative.   Neurological: Negative.   Endo/Heme/Allergies: Negative.   Psychiatric/Behavioral: Positive for depression, suicidal ideas and substance abuse. The patient is nervous/anxious and has insomnia.     Blood pressure 114/79, pulse 134, temperature 97.3 F (36.3 C), temperature source Oral, resp. rate 20, height 6\' 6"  (1.981 m), weight 57.153 kg (126 lb), SpO2 97.00%.Body mass index is 14.56 kg/(m^2).  General Appearance: Fairly Groomed  Patent attorney::  Fair  Speech:  Clear and Coherent and Pressured  Volume:  fluctuates  Mood:  Anxious, Depressed  and overwhelmed  Affect:  anxious  Thought Process:  Coherent and Goal Directed  Orientation:  Full (Time, Place, and Person)  Thought Content:  worries, concerns, ruminations  Suicidal Thoughts:  Yes.  without intent/plan  Homicidal Thoughts:  No  Memory:  Immediate;   Fair Recent;   Fair Remote;   Fair  Judgement:  Fair  Insight:  Present and superficial  Psychomotor Activity:  Restlessness  Concentration:  Fair  Recall:  Fair  Akathisia:  No  Handed:    AIMS (if indicated):     Assets:  Desire for Improvement Social Support  Sleep:  Number of Hours: 6.25   Current Medications: Current Facility-Administered Medications  Medication Dose Route Frequency Provider Last Rate Last Dose  . acetaminophen (TYLENOL) tablet 650 mg  650 mg Oral Q6H PRN Earney Navy, NP      . alum & mag hydroxide-simeth (MAALOX/MYLANTA) 200-200-20 MG/5ML suspension 30 mL  30 mL Oral Q4H PRN Earney Navy, NP      . amoxicillin-clavulanate (AUGMENTIN) 875-125 MG per tablet 1 tablet  1 tablet Oral Q12H Verne Spurr, PA-C   1 tablet at 01/07/13 0839  . busPIRone (BUSPAR) tablet 10 mg  10 mg Oral TID Earney Navy, NP   10 mg at 01/07/13 1649  . chlordiazePOXIDE (LIBRIUM) capsule 25 mg  25 mg Oral BH-qamhs Fransisca Kaufmann, NP   25 mg at 01/07/13 0842   Followed by  . [START ON 01/08/2013] chlordiazePOXIDE (LIBRIUM) capsule 25 mg  25 mg Oral Daily Fransisca Kaufmann, NP      . feeding supplement (ENSURE COMPLETE) liquid  237 mL  237 mL Oral TID BM Earna Coder, RD   237 mL at 01/07/13 1604  . gabapentin (NEURONTIN) capsule 200 mg  200 mg Oral TID Sanjuana Kava, NP   200 mg at 01/07/13 1649  . magnesium hydroxide (MILK OF MAGNESIA) suspension 30 mL  30 mL Oral Daily PRN Earney Navy, NP      . multivitamin with minerals tablet 1 tablet  1 tablet Oral Daily Fransisca Kaufmann, NP   1 tablet at 01/07/13 0840  . nicotine (NICODERM CQ - dosed in mg/24 hours) patch 14 mg  14 mg Transdermal Q0600 Rachael Fee,  MD   14 mg at 01/07/13 0843  . ondansetron (ZOFRAN) tablet 4 mg  4 mg Oral Q6H PRN Rachael Fee, MD   4 mg at 01/06/13 2011  . propranolol (INDERAL) tablet 10 mg  10 mg Oral BID Rachael Fee, MD      . thiamine (VITAMIN B-1) tablet 100 mg  100 mg Oral Daily Fransisca Kaufmann, NP   100 mg at 01/07/13 0840  . zolpidem (AMBIEN) tablet 10 mg  10 mg Oral QHS PRN Rachael Fee, MD        Lab Results: No results found for this or any previous visit (from the past 48 hour(s)).  Physical Findings: AIMS: Facial and Oral Movements Muscles of Facial Expression: None, normal Lips and Perioral Area: None, normal Jaw: None, normal Tongue: None, normal,Extremity Movements Upper (arms, wrists, hands, fingers): None, normal Lower (legs, knees, ankles, toes): None, normal, Trunk Movements Neck, shoulders, hips: None, normal, Overall Severity Severity of abnormal movements (highest score from questions above): None, normal Incapacitation due to abnormal movements: None, normal Patient's awareness of abnormal movements (rate only patient's report): No Awareness, Dental Status Current problems with teeth and/or dentures?: No Does patient usually wear dentures?: No  CIWA:  CIWA-Ar Total: 8 COWS:  COWS Total Score: 7  Treatment Plan Summary: Daily contact with patient to assess and evaluate symptoms and progress in treatment Medication management  Plan: Supportive approach/coping skills/relapse prevention           Reassess and address the treatment for his panic/anxiety disorder           CBT/Mindfulness           D/C Paxil           Has been experiencing increased heart rate: will try Inderal 10 mg BID checking the BP Medical Decision Making Problem Points:  Review of psycho-social stressors (1) Data Points:  Review of medication regiment & side effects (2) Review of new medications or change in dosage (2)  I certify that inpatient services furnished can reasonably be expected to improve the patient's  condition.   Anali Cabanilla A 01/07/2013, 5:57 PM

## 2013-01-07 NOTE — B[facility] Group Notes (Signed)
[facility] LCSW Group Therapy  01/07/2013  1:15 PM   Type of Therapy:  Group Therapy  Participation Level:  Did Not Attend  [name], LCSWA 01/07/2013 2:15 PM

## 2013-01-07 NOTE — Progress Notes (Signed)
Patient ID: [name], male   DOB: [birth year], 22 y.o.   MRN: [id number] D: pt. In room sitting on bed, also noted in hall and dayroom. Pt. Reports day been "so so" "I'm depressed because I'm afraid I will have to live with anxiety for the rest of my life" A: Writer introduced self to client and encouraged him to use coping skills, also i.e.deep breathing. Writer encouraged group. Staff will monitor q71min for safety. R: pt. Is safe on the unit. Pt. Attended group.

## 2013-01-07 NOTE — B[facility] Group Notes (Signed)
[facility] LCSW Aftercare Discharge Planning Group Note   01/07/2013 8:45 AM  Participation Quality:  Alert and Appropriate   Mood/Affect:  Appropriate, Anxious  Depression Rating:  4  Anxiety Rating:  "25"  Thoughts of Suicide:  Pt denies HI, endorses SI  Will you contract for safety?   Yes  Current AVH:  Pt denies  Plan for Discharge/Comments:  Pt attended discharge planning group and actively participated in group.  CSW provided pt with today's workbook.  Pt states that he came to the hospital for panic attacks, anxiety and SI.  Pt states that he has very high anxiety today and poor sleep last night.  Pt states that he can't return to the home he was staying in and wants assistance in finding housing. Pt states that if he has to stay in a homeless shelter he will kill himself.  CSW will provide pt with a list of [employer] and encouraged pt to begin working on seeking housing now.  CSW will assess for appropriate referrals.  No further needs voiced by pt at this time.    Transportation Means: Pt reports access to transportation  Supports: No supports mentioned at this time  [name], LCSWA 01/07/2013 10:02 AM

## 2013-01-08 MED ORDER — HYDROXYZINE HCL 50 MG PO TABS
50.0000 mg | ORAL_TABLET | ORAL | Status: AC
  Administered 2013-01-08: 50 mg via ORAL
  Filled 2013-01-08 (×2): qty 1

## 2013-01-08 MED ORDER — TRAZODONE HCL 50 MG PO TABS
50.0000 mg | ORAL_TABLET | Freq: Every evening | ORAL | Status: DC | PRN
  Administered 2013-01-08: 50 mg via ORAL
  Filled 2013-01-08: qty 1

## 2013-01-08 MED ORDER — TRAZODONE HCL 100 MG PO TABS: 100.0000 mg | ORAL_TABLET | Freq: Every evening | ORAL | Status: DC | PRN

## 2013-01-08 MED ORDER — GABAPENTIN 300 MG PO CAPS
300.0000 mg | ORAL_CAPSULE | Freq: Three times a day (TID) | ORAL | Status: DC
  Administered 2013-01-08 – 2013-01-11 (×11): 300 mg via ORAL
  Filled 2013-01-08 (×13): qty 1

## 2013-01-08 MED ORDER — MIRTAZAPINE 30 MG PO TABS
30.0000 mg | ORAL_TABLET | Freq: Every day | ORAL | Status: DC
  Administered 2013-01-08 – 2013-01-10 (×3): 30 mg via ORAL
  Filled 2013-01-08 (×4): qty 1

## 2013-01-08 NOTE — Progress Notes (Signed)
Recreation Therapy Notes  Date: 09.30.2014 Time: 3:00pm Location: 300 [address] Dayroom  Group Topic: Communication  Goal Area(s) Addresses:  Patient will effectively communicate verbally with group members. Patient will effectively communicate non-verbally with group members.  Patient will identify benefit of healthy communication.   Behavioral Response: Appropriate, Sharing, Supportive  Intervention: Game  Activity: Random Words. Game was played in two rounds. Rnd 1 - patients were asked to describe the word selected from provided contained in 5 qualifiers or less. Rnd 2 - patients were asked to use action to get group members to guess word selected from provided container.   Education: Communication, Discharge Planning   Education Outcome: Needs additional education.   Clinical Observations/Feedback: Patient participated in group activity, using good descriptors and actions to have group members identify selected words. During group discussion patient shared that he has a very close relationships with his father and that his father is the one person who accepts him for exactly who he is. Patient also spoke his and his father using crack and heroin together, patient refers to his father as his best friend. Patient expressed deep love and admiration for his father, however he shared stories that portrayed his father as a drug user ("My dad would come home from work and lock himself in the bathroom and use $600 - $700 worth of crack.") and neglectful ("He would tell me he was going to do something with me and wouldn't show."). Patient additionally supported and related to peers as they shared personal stories.   Marykay Lex Iza Preston, LRT/CTRS  Jearl Klinefelter 01/08/2013 4:54 PM

## 2013-01-08 NOTE — Consult Note (Signed)
Note reviewed and agreed with  

## 2013-01-08 NOTE — Progress Notes (Signed)
D: Patient denies HI and A/V hallucinations and on and off thoughts of SI; patient reports his sleep is poor because he did not get any sleep after medication was given; reports appetite is poor ; reports energy level is low  ; reports ability to pay attention is poor; rates depression as 10/10; rates hopelessness 9/10; patient states that " I feel like I'll never be anxiety free  A: Monitored q 15 minutes; patient encouraged to attend groups; patient educated about medications; patient given medications per physician orders; patient encouraged to express feelings and/or concerns  R: Patient is cooperative and observed sleep on the chair in the dayroom twice; patient's interaction with staff and peers is appropriate and animated; patient was able to set goal to talk with staff 1:1 when having feelings of SI; patient is taking medications as prescribed and tolerating medications; patient is attending all groups

## 2013-01-08 NOTE — BHH Group Notes (Signed)
The focus of this group is to educate the patient on the purpose and policies of crisis stabilization and provide a format to answer questions about their admission.  The group details unit policies and expectations of patients while admitted.  Stretching exercises done.  Pt attended group and was engaged, active participation with some monopolizing and redirection needed.  Pt enmeshed with Shanda Bumps during group and focused on talking about his medications and the fact that he is not getting benzodiazepines while here at [facility] and how he is dissatisfied with that.  Pt also talked about being dissatisfied with the care he was given by the weekend PA.

## 2013-01-08 NOTE — Progress Notes (Signed)
Recreation Therapy Notes  Date: 09.30.2014 Time: 2:30pm Location: 300 [address] Dayroom  Group Topic: Software engineer Activities (AAA)  Behavioral Response: Engaged, Attentive, Appropriate  Affect: Euthymic  Clinical Observations/Feedback: Dog Team: Education officer, museum. Patient interacted appropriately with peer, dog team, LRT and MHT.   Marykay Lex Tresea Heine, LRT/CTRS  Dustan Hyams L 01/08/2013 4:02 PM

## 2013-01-08 NOTE — BHH Suicide Risk Assessment (Signed)
[facility] INPATIENT:  Family/Significant Other Suicide Prevention Education  Suicide Prevention Education:  Education Completed; Marice Angelino Sr. - father [phone]),  (name of family member/significant other) has been identified by the patient as the family member/significant other with whom the patient will be residing, and identified as the person(s) who will aid the patient in the event of a mental health crisis (suicidal ideations/suicide attempt).  With written consent from the patient, the family member/significant other has been provided the following suicide prevention education, prior to the and/or following the discharge of the patient.  The suicide prevention education provided includes the following:  Suicide risk factors  Suicide prevention and interventions  National Suicide Hotline telephone number  [facility] assessment telephone number  [facility] Emergency Assistance 911  [facility] and/or Residential Mobile Crisis Unit telephone number  Request made of family/significant other to:  Remove weapons (e.g., guns, rifles, knives), all items previously/currently identified as safety concern.    Remove drugs/medications (over-the-counter, prescriptions, illicit drugs), all items previously/currently identified as a safety concern.  The family member/significant other verbalizes understanding of the suicide prevention education information provided.  The family member/significant other agrees to remove the items of safety concern listed above.  Carmina Miller 01/08/2013, 3:05 PM

## 2013-01-08 NOTE — Progress Notes (Signed)
Adult Psychoeducational Group Note  Date:  01/08/2013 Time:  11:54 PM  Group Topic/Focus:  Wrap-Up Group:   The focus of this group is to help patients review their daily goal of treatment and discuss progress on daily workbooks.  Participation Level:  Active  Participation Quality:  Appropriate and Sharing  Affect:  Appropriate  Cognitive:  Appropriate  Insight: Appropriate  Engagement in Group:  Engaged  Modes of Intervention:  Discussion, Education, Socialization and Support  Additional Comments:  Pt stated that he is in the hospital for a panic disorder. Pt stated that he is academically gifted and he is unique because he is left handed.   Laural Benes, Jimmye Wisnieski 01/08/2013, 11:54 PM

## 2013-01-08 NOTE — Progress Notes (Signed)
[facility] MD Progress Note  01/08/2013 4:05 PM [name]  MRN:  [id number] Subjective:  [name] states he did not sleep last night. Did better with the Remeron and the Trazodone. Also states that the anxiety continues to be really bad, but that the Neurontin does help some. He states that he really wants to get his life back together. 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 States that he wants to go to a long term treatment program He is still getting agitated and admits that occasionally thinks about hurting himself when he looks at how his life has turned Diagnosis:   DSM5: Schizophrenia Disorders:   Obsessive-Compulsive Disorders:   Trauma-Stressor Disorders:  Posttraumatic Stress Disorder (309.81) Substance/Addictive Disorders:  Alcohol Related Disorder - Moderate (303.90) Depressive Disorders:  Major Depressive Disorder - Moderate (296.22)  Axis I: Panic Disorder  ADL's:  Intact  Sleep: Poor  Appetite:  Fair  Suicidal Ideation:  Plan:  denies Intent:  denies Means:  denies Homicidal Ideation:  Plan:  denies Intent:  denies Means:  denies AEB (as evidenced by):  Psychiatric Specialty Exam: Review of Systems  Constitutional: Negative.   HENT: Negative.   Eyes: Negative.   Respiratory: Negative.   Cardiovascular: Negative.   Gastrointestinal: Negative.   Genitourinary: Negative.   Musculoskeletal: Negative.   Skin: Negative.   Neurological: Negative.   Endo/Heme/Allergies: Negative.   Psychiatric/Behavioral: Positive for depression, suicidal ideas and substance abuse. The patient is nervous/anxious and has insomnia.     Blood pressure 104/67, pulse 106, temperature 97.7 F (36.5 C), temperature source Oral, resp. rate 18, height 6\' 6"  (1.981 m), weight 57.153 kg (126 lb), SpO2 97.00%.Body mass index is 14.56 kg/(m^2).  General Appearance: Disheveled  Eye Solicitor::  Fair  Speech:  Clear and Coherent and Slow  Volume:  Decreased  Mood:  Anxious and  Depressed  Affect:  Restricted  Thought Process:  Coherent and Goal Directed  Orientation:  Full (Time, Place, and Person)  Thought Content:  worries, concerns, symptoms, wanting to get his life back on track  Suicidal Thoughts:  No, Occasionally starts thinking that there is no hope for him, starts thinking he would rather be dead  Homicidal Thoughts:  No  Memory:  Immediate;   Fair Recent;   Fair Remote;   Fair  Judgement:  Fair  Insight:  Present and superficial  Psychomotor Activity:  Restlessness and sleepy, tired, did not sleep last night  Concentration:  Fair  Recall:  Fair  Akathisia:  No  Handed:    AIMS (if indicated):     Assets:  Desire for Improvement  Sleep:  Number of Hours: 0   Current Medications: Current Facility-Administered Medications  Medication Dose Route Frequency Provider Last Rate Last Dose  . acetaminophen (TYLENOL) tablet 650 mg  650 mg Oral Q6H PRN Earney Navy, NP      . alum & mag hydroxide-simeth (MAALOX/MYLANTA) 200-200-20 MG/5ML suspension 30 mL  30 mL Oral Q4H PRN Earney Navy, NP      . busPIRone (BUSPAR) tablet 10 mg  10 mg Oral TID Earney Navy, NP   10 mg at 01/08/13 1158  . feeding supplement (ENSURE COMPLETE) liquid 237 mL  237 mL Oral TID BM Earna Coder, RD   237 mL at 01/08/13 1400  . gabapentin (NEURONTIN) capsule 300 mg  300 mg Oral TID Rachael Fee, MD   300 mg at 01/08/13 1256  . magnesium hydroxide (  MILK OF MAGNESIA) suspension 30 mL  30 mL Oral Daily PRN Earney Navy, NP      . mirtazapine (REMERON) tablet 30 mg  30 mg Oral QHS Rachael Fee, MD      . multivitamin with minerals tablet 1 tablet  1 tablet Oral Daily Fransisca Kaufmann, NP   1 tablet at 01/08/13 0805  . nicotine (NICODERM CQ - dosed in mg/24 hours) patch 14 mg  14 mg Transdermal Q0600 Rachael Fee, MD   14 mg at 01/08/13 4696  . ondansetron (ZOFRAN) tablet 4 mg  4 mg Oral Q6H PRN Rachael Fee, MD   4 mg at 01/06/13 2011  . propranolol (INDERAL)  tablet 10 mg  10 mg Oral BID Rachael Fee, MD   10 mg at 01/08/13 0805  . thiamine (VITAMIN B-1) tablet 100 mg  100 mg Oral Daily Fransisca Kaufmann, NP   100 mg at 01/08/13 0805  . traZODone (DESYREL) tablet 50 mg  50 mg Oral QHS PRN Rachael Fee, MD        Lab Results: No results found for this or any previous visit (from the past 48 hour(s)).  Physical Findings: AIMS: Facial and Oral Movements Muscles of Facial Expression: None, normal Lips and Perioral Area: None, normal Jaw: None, normal Tongue: None, normal,Extremity Movements Upper (arms, wrists, hands, fingers): None, normal Lower (legs, knees, ankles, toes): None, normal, Trunk Movements Neck, shoulders, hips: None, normal, Overall Severity Severity of abnormal movements (highest score from questions above): None, normal Incapacitation due to abnormal movements: None, normal Patient's awareness of abnormal movements (rate only patient's report): No Awareness, Dental Status Current problems with teeth and/or dentures?: No Does patient usually wear dentures?: No  CIWA:  CIWA-Ar Total: 1 COWS:  COWS Total Score: 7  Treatment Plan Summary: Daily contact with patient to assess and evaluate symptoms and progress in treatment Medication management  Plan: Supportive approach/coping skills/relapse prevention           CBT/challenge the distortions            Increase the Neurontin to 300 mg TID            D/C the Ambien            Remeron 30 mg HS            Trazodone 50 mg HS PRN sleep  Medical Decision Making Problem Points:  Review of psycho-social stressors (1) Data Points:  Review of medication regiment & side effects (2)  I certify that inpatient services furnished can reasonably be expected to improve the patient's condition.   Trenise Turay A 01/08/2013, 4:05 PM
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
## 2013-01-09 DIAGNOSIS — F411 Generalized anxiety disorder: Secondary | ICD-10-CM

## 2013-01-09 MED ORDER — PANTOPRAZOLE SODIUM 40 MG PO TBEC
40.0000 mg | DELAYED_RELEASE_TABLET | Freq: Every day | ORAL | Status: DC
  Administered 2013-01-09 – 2013-01-11 (×3): 40 mg via ORAL
  Filled 2013-01-09 (×5): qty 1

## 2013-01-09 MED ORDER — QUETIAPINE FUMARATE 50 MG PO TABS
50.0000 mg | ORAL_TABLET | Freq: Two times a day (BID) | ORAL | Status: DC
  Administered 2013-01-09 – 2013-01-10 (×2): 50 mg via ORAL
  Filled 2013-01-09 (×5): qty 1

## 2013-01-09 MED ORDER — CITALOPRAM HYDROBROMIDE 10 MG PO TABS
10.0000 mg | ORAL_TABLET | Freq: Every day | ORAL | Status: DC
  Administered 2013-01-09 – 2013-01-11 (×3): 10 mg via ORAL
  Filled 2013-01-09 (×5): qty 1

## 2013-01-09 MED ORDER — TRAZODONE HCL 100 MG PO TABS
100.0000 mg | ORAL_TABLET | Freq: Every day | ORAL | Status: DC
  Administered 2013-01-09 – 2013-01-10 (×2): 100 mg via ORAL
  Filled 2013-01-09 (×4): qty 1

## 2013-01-09 NOTE — [facility] Group Notes (Signed)
[facility] LCSW Aftercare Discharge Planning Group Note   01/09/2013 8:45 AM  Participation Quality:  Alert and Appropriate   Mood/Affect:  Appropriate, Blunted, Depressed  Depression Rating:  4  Anxiety Rating:  9  Thoughts of Suicide:  Pt denies SI/HI  Will you contract for safety?   Yes  Current AVH:  Pt denies  Plan for Discharge/Comments:  Pt attended discharge planning group and actively participated in group.  CSW provided pt with today's workbook.  Pt reports being very upset today about his medication adjustments taking place.  Pt was provided a list of [employer] but pt states that he can return home with his father now.  Pt is wanting to follow up at [facility] for further treatment.  CSW awaiting a bed date.  No further needs voiced by pt at this time.    Transportation Means: Pt reports access to transportation  Supports: Father is supportive  When CSW attempted to round patients up for group pt was found in his room with a male peer has become close to.  CSW told both patients they are not to be in someone else's room, especially opposite sex.  Patients claim they didn't know this policy and pt states "you know we're both gay!".    [name], LCSWA 01/09/2013 9:38 AM

## 2013-01-09 NOTE — Progress Notes (Signed)
D: Patient denies HI and A/V hallucinations and reports on and off thoughts of SI; patient reports sleep is well; reports appetite is improving; reports energy level is hyper ; reports ability to pay attention is poor;  rates hopelessness 9/10; rates anxiety as 8/10;   A: Monitored q 15 minutes; patient encouraged to attend groups; patient educated about medications; patient given medications per physician orders; patient encouraged to express feelings and/or concerns  R: Patient became agitated and started to escalate when he did not have a certain medication present; patient calmed down after being redirected; patient's interaction with staff and peers is appropriate; patient was able to set goal to talk with staff 1:1 when having feelings of SI; patient is taking medications as prescribed and tolerating medications; patient is attending all groups

## 2013-01-09 NOTE — Progress Notes (Signed)
[facility] MD Progress Note  01/09/2013 5:00 PM [name]  MRN:  [id number] Subjective:  Did sleep better last night. Still endorses anxiety, agitation, racing thoughts. States that the racing thoughts are always there.When he starts thinking about something he cant quit thinking. It can be worries, but also nonsensical thoughts. He has had this since he was very young. He has a degree of inattentiveness and easy distractibility as well as hyperactivity. He wants to make things  to be better and be sober.  Diagnosis:   DSM5: Schizophrenia Disorders:   Obsessive-Compulsive Disorders:   Trauma-Stressor Disorders:  PTSD Substance/Addictive Disorders:  Alcohol Related Disorder - Moderate (303.90) Depressive Disorders:    Anxiety disorder NOS, R/O ADHD  ADL's:  Intact  Sleep: Fair  Appetite:  Fair  Suicidal Ideation:  Plan:  denies Intent:  denies Means:  denies Homicidal Ideation:  Plan:  denies Intent:  denies Means:  denies AEB (as evidenced by):  Psychiatric Specialty Exam: Review of Systems  Constitutional: Negative.   HENT: Negative.   Eyes: Negative.   Respiratory: Negative.   Cardiovascular: Negative.   Gastrointestinal: Negative.   Genitourinary: Negative.   Musculoskeletal: Negative.   Skin: Negative.   Neurological: Negative.   Endo/Heme/Allergies: Negative.   Psychiatric/Behavioral: Positive for substance abuse. The patient is nervous/anxious.     Blood pressure 106/73, pulse 112, temperature 97.2 F (36.2 C), temperature source Oral, resp. rate 16, height 6\' 6"  (1.981 m), weight 57.153 kg (126 lb), SpO2 97.00%.Body mass index is 14.56 kg/(m^2).  General Appearance: Fairly Groomed  Patent attorney::  Fair  Speech:  Clear and Coherent  Volume:  Normal  Mood:  Anxious and Irritable  Affect:  anxious  Thought Process:  Coherent and Goal Directed  Orientation:  Full (Time, Place, and Person)  Thought Content:  worries, concerns, ruminations  Suicidal Thoughts:  No   Homicidal Thoughts:  No  Memory:  Immediate;   Fair Recent;   Fair Remote;   Fair  Judgement:  Fair  Insight:  Present  Psychomotor Activity:  Restlessness  Concentration:  Fair  Recall:  Fair  Akathisia:  No  Handed:    AIMS (if indicated):     Assets:  Desire for Improvement Social Support  Sleep:  Number of Hours: 6.75   Current Medications: Current Facility-Administered Medications  Medication Dose Route Frequency Provider Last Rate Last Dose  . acetaminophen (TYLENOL) tablet 650 mg  650 mg Oral Q6H PRN Earney Navy, NP      . alum & mag hydroxide-simeth (MAALOX/MYLANTA) 200-200-20 MG/5ML suspension 30 mL  30 mL Oral Q4H PRN Earney Navy, NP      . busPIRone (BUSPAR) tablet 10 mg  10 mg Oral TID Earney Navy, NP   10 mg at 01/09/13 1215  . citalopram (CELEXA) tablet 10 mg  10 mg Oral Daily Rachael Fee, MD   10 mg at 01/09/13 1308  . feeding supplement (ENSURE COMPLETE) liquid 237 mL  237 mL Oral TID BM Earna Coder, RD   237 mL at 01/09/13 1400  . gabapentin (NEURONTIN) capsule 300 mg  300 mg Oral TID Rachael Fee, MD   300 mg at 01/09/13 1215  . magnesium hydroxide (MILK OF MAGNESIA) suspension 30 mL  30 mL Oral Daily PRN Earney Navy, NP      . mirtazapine (REMERON) tablet 30 mg  30 mg Oral QHS Rachael Fee, MD   30 mg at 01/08/13 2116  . multivitamin with  minerals tablet 1 tablet  1 tablet Oral Daily Fransisca Kaufmann, NP   1 tablet at 01/09/13 0745  . nicotine (NICODERM CQ - dosed in mg/24 hours) patch 14 mg  14 mg Transdermal Q0600 Rachael Fee, MD   14 mg at 01/09/13 0746  . pantoprazole (PROTONIX) EC tablet 40 mg  40 mg Oral Daily Rachael Fee, MD   40 mg at 01/09/13 1308  . propranolol (INDERAL) tablet 10 mg  10 mg Oral BID Rachael Fee, MD   10 mg at 01/09/13 0745  . QUEtiapine (SEROQUEL) tablet 50 mg  50 mg Oral BID Rachael Fee, MD   50 mg at 01/09/13 1308  . thiamine (VITAMIN B-1) tablet 100 mg  100 mg Oral Daily Fransisca Kaufmann, NP   100 mg  at 01/09/13 0746  . traZODone (DESYREL) tablet 100 mg  100 mg Oral QHS Rachael Fee, MD        Lab Results: No results found for this or any previous visit (from the past 48 hour(s)).  Physical Findings: AIMS: Facial and Oral Movements Muscles of Facial Expression: None, normal Lips and Perioral Area: None, normal Jaw: None, normal Tongue: None, normal,Extremity Movements Upper (arms, wrists, hands, fingers): None, normal Lower (legs, knees, ankles, toes): None, normal, Trunk Movements Neck, shoulders, hips: None, normal, Overall Severity Severity of abnormal movements (highest score from questions above): None, normal Incapacitation due to abnormal movements: None, normal Patient's awareness of abnormal movements (rate only patient's report): No Awareness, Dental Status Current problems with teeth and/or dentures?: No Does patient usually wear dentures?: No  CIWA:  CIWA-Ar Total: 0 COWS:  COWS Total Score: 7  Treatment Plan Summary: Daily contact with patient to assess and evaluate symptoms and progress in treatment Medication management  Plan: Supportive approach/copign skills/relapse prevention           CBT/mindfulness           Trial with Seroquel 50 mg BID            Will change the Trazodone to 100 mg HS (got both 50's last night)  Medical Decision Making Problem Points:  Review of psycho-social stressors (1) Data Points:  Review of medication regiment & side effects (2) Review of new medications or change in dosage (2)  I certify that inpatient services furnished can reasonably be expected to improve the patient's condition.   Leyani Gargus A 01/09/2013, 5:00 PM
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
## 2013-01-09 NOTE — Progress Notes (Signed)
Pt atended Group

## 2013-01-09 NOTE — Tx Team (Signed)
Interdisciplinary Treatment Plan Update (Adult)  Date: 01/09/2013  Time Reviewed:  9:45 AM  Progress in Treatment: Attending groups: Yes Participating in groups:  Yes Taking medication as prescribed:  Yes Tolerating medication:  Yes Family/Significant othe contact made: Yes Patient understands diagnosis:  Yes Discussing patient identified problems/goals with staff:  Yes Medical problems stabilized or resolved:  Yes Denies suicidal/homicidal ideation: Yes Issues/concerns per patient self-inventory:  Yes Other:  New problem(s) identified: N/A  Discharge Plan or Barriers: CSW awaiting bed date for further treatment at [facility].    Reason for Continuation of Hospitalization: Anxiety Depression Detox Medication Stabilization  Comments: N/A  Estimated length of stay: 1-2 days  For review of initial/current patient goals, please see plan of care.  Attendees: Patient:     Family:     Physician:  Dr. Dub Mikes 01/09/2013 10:26 AM   Nursing:   Burnetta Sabin, RN 01/09/2013 10:26 AM   Clinical Social Worker:  [name], LCSWA 01/09/2013 10:26 AM   Other: Onnie Boer, RN case manager 01/09/2013 10:26 AM   Other:  Trula Slade, LCSWA 01/09/2013 10:26 AM   Other:  Serena Colonel, Np 01/09/2013 10:26 AM   Other:  Roswell Miners, RN 01/09/2013 10:26 AM   Other:    Other:    Other:    Other:    Other:    Other:     Scribe for Treatment Team:   Carmina Miller, 01/09/2013 , 10:26 AM

## 2013-01-09 NOTE — Progress Notes (Signed)
Writer observed patient sitting in the dayroom watching tv with minimal interaction with peers. Writer spoke with patient 1:1 and he c/o trouble sleeping last night and is hopeful that the medications ordered for sleep will work. Patients conversation is focused on his panic attacks and insomnia. Writer redirected patient to positive techniques, deep breathing and distraction. Patient was receptive. Support and encouragement offered. Patient denies si/hi/a/v hallucinations. Safety maintained on unit with 15 min checks, will continue to monitor.

## 2013-01-10 DIAGNOSIS — F329 Major depressive disorder, single episode, unspecified: Secondary | ICD-10-CM

## 2013-01-10 MED ORDER — QUETIAPINE FUMARATE 50 MG PO TABS: 50.0000 mg | ORAL_TABLET | Freq: Three times a day (TID) | ORAL | Status: DC

## 2013-01-10 MED ORDER — QUETIAPINE FUMARATE 50 MG PO TABS
50.0000 mg | ORAL_TABLET | Freq: Three times a day (TID) | ORAL | Status: DC
  Administered 2013-01-10 – 2013-01-11 (×4): 50 mg via ORAL
  Filled 2013-01-10 (×7): qty 1

## 2013-01-10 NOTE — Progress Notes (Signed)
[facility] MD Progress Note  01/10/2013 8:05 PM [name]  MRN:  [id number] Subjective:  [name] continues to work on his recovery. He still complains of anxiety, agitation in between dosages of Seroquel. States that while the Seroquel is working he can tell a big difference. He continues to sleep better. 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   HENT: Negative.   Eyes: Negative.   Respiratory: Negative.   Cardiovascular: Negative.   Gastrointestinal: Negative.   Genitourinary: Negative.   Musculoskeletal: Negative.   Skin: Negative.   Neurological: Negative.   Endo/Heme/Allergies: Negative.   Psychiatric/Behavioral: Positive for substance abuse. The patient is nervous/anxious.     Blood pressure 114/74, pulse 117, temperature 97.1 F (36.2 C), temperature source Oral, resp. rate 18, height 6\' 6"  (1.981 m), weight 57.153 kg (126 lb), SpO2 97.00%.Body mass index is 14.56 kg/(m^2).  General Appearance: Fairly Groomed  Patent attorney::  Fair  Speech:  Clear and Coherent  Volume:  Normal  Mood:  Anxious  Affect:  anxious  Thought Process:  Coherent and Goal Directed  Orientation:  Full (Time, Place, and Person)  Thought Content:  worries, concerns  Suicidal Thoughts:  No  Homicidal Thoughts:  No  Memory:  Immediate;   Fair Recent;   Fair Remote;   Fair  Judgement:   Fair  Insight:  Present  Psychomotor Activity:  Restlessness  Concentration:  Fair  Recall:  Fair  Akathisia:  No  Handed:    AIMS (if indicated):     Assets:  Desire for Improvement Social Support  Sleep:  Number of Hours: 6   Current Medications: Current Facility-Administered Medications  Medication Dose Route Frequency Provider Last Rate Last Dose  . acetaminophen (TYLENOL) tablet 650 mg  650 mg Oral Q6H PRN Earney Navy, NP      . alum & mag hydroxide-simeth (MAALOX/MYLANTA) 200-200-20 MG/5ML suspension 30 mL  30 mL Oral Q4H PRN Earney Navy, NP      . busPIRone (BUSPAR) tablet 10 mg  10 mg Oral TID Earney Navy, NP   10 mg at 01/10/13 1712  . citalopram (CELEXA) tablet 10 mg  10 mg Oral Daily Rachael Fee, MD   10 mg at 01/10/13 0744  . feeding supplement (ENSURE COMPLETE) liquid 237 mL  237 mL Oral TID BM Earna Coder, RD   237 mL at 01/10/13 1958  . gabapentin (NEURONTIN) capsule 300 mg  300 mg Oral TID Rachael Fee, MD   300 mg at 01/10/13 1713  . magnesium hydroxide (MILK OF MAGNESIA) suspension 30 mL  30 mL Oral Daily PRN Earney Navy, NP      . mirtazapine (REMERON) tablet 30 mg  30 mg Oral QHS Rachael Fee, MD   30 mg at 01/09/13 2128  . multivitamin with minerals tablet 1 tablet  1 tablet Oral Daily Fransisca Kaufmann, NP   1 tablet at 01/10/13  68  . nicotine (NICODERM CQ - dosed in mg/24 hours) patch 14 mg  14 mg Transdermal Q0600 Rachael Fee, MD   14 mg at 01/10/13 [phone]  . pantoprazole (PROTONIX) EC tablet 40 mg  40 mg Oral Daily Rachael Fee, MD   40 mg at 01/10/13 0744  . propranolol (INDERAL) tablet 10 mg  10 mg Oral BID Rachael Fee, MD   10 mg at 01/10/13 1712  . QUEtiapine (SEROQUEL) tablet 50 mg  50 mg Oral TID Rachael Fee, MD   50 mg at 01/10/13 1712  . thiamine (VITAMIN B-1) tablet 100 mg  100 mg Oral Daily Fransisca Kaufmann, NP   100 mg at 01/10/13 0744  . traZODone (DESYREL) tablet 100 mg  100 mg Oral QHS Rachael Fee, MD   100 mg at  01/09/13 2128    Lab Results: No results found for this or any previous visit (from the past 48 hour(s)).  Physical Findings: AIMS: Facial and Oral Movements Muscles of Facial Expression: None, normal Lips and Perioral Area: None, normal Jaw: None, normal Tongue: None, normal,Extremity Movements Upper (arms, wrists, hands, fingers): None, normal Lower (legs, knees, ankles, toes): None, normal, Trunk Movements Neck, shoulders, hips: None, normal, Overall Severity Severity of abnormal movements (highest score from questions above): None, normal Incapacitation due to abnormal movements: None, normal Patient's awareness of abnormal movements (rate only patient's report): No Awareness, Dental Status Current problems with teeth and/or dentures?: No Does patient usually wear dentures?: No  CIWA:  CIWA-Ar Total: 0 COWS:  COWS Total Score: 7  Treatment Plan Summary: Daily contact with patient to assess and evaluate symptoms and progress in treatment Medication management  Plan: Supportive approach/coping skills/relapse prevention           CBT/mindfulness           Increase the Seroquel to 50 mg TID  Medical Decision Making Problem Points:  Review of psycho-social stressors (1) Data Points:  Review of new medications or change in dosage (2)  I certify that inpatient services furnished can reasonably be expected to improve the patient's condition.   Latrise Bowland A 01/10/2013, 8:05 PM

## 2013-01-10 NOTE — Progress Notes (Signed)
Adult Psychoeducational Group Note  Date:  01/10/2013 Time:  3:25 PM  Group Topic/Focus:  Building Self Esteem:   The Focus of this group is helping patients become aware of the effects of self-esteem on their lives, the things they and others do that enhance or undermine their self-esteem, seeing the relationship between their level of self-esteem and the choices they make and learning ways to enhance self-esteem.  Participation Level:  Active  Participation Quality:  Appropriate, Attentive and Sharing  Affect:  Appropriate  Cognitive:  Alert, Appropriate and Oriented  Insight: Good and Improving  Engagement in Group:  Engaged  Modes of Intervention:  Discussion, Education and Support  Additional Comments:  During group pt talked about things he enjoys, reading is his favorite past time.  Reynolds Bowl 01/10/2013, 3:25 PM

## 2013-01-10 NOTE — Progress Notes (Signed)
D: Patient denies SI/HI and A/V hallucinations; patient reports sleep is well; reports appetite is good; reports energy level is normal ; reports ability to pay attention is improving; rates depression as 1/10; rates hopelessness 0/10; reports he feels like he is about to jump out of her skin and feels that he needs a third dose of seroquel  A: Monitored q 15 minutes; patient encouraged to attend groups; patient educated about medications; patient given medications per physician orders; patient encouraged to express feelings and/or concerns  R: Patient is has some relief of anxiety and patient is able to focus ; patient's interaction with staff and peers is appropriate and is minimal; patient was able to set goal to talk with staff 1:1 when having feelings of SI; patient is taking medications as prescribed and tolerating medications; patient is attending all groups

## 2013-01-10 NOTE — Progress Notes (Signed)
D: Pt has been pleasant tonight. He attended group therapy and has been interacting well within the milieu. Pt stated that he is happy that his trazodone has been increased because he hasn't been sleeping well.  A: Support given. Verbalization encouraged. Pt encouraged to come to staff with any concerns. Medications given as ordered.  R: Pt is receptive. No complaints of pain or discomfort at this time. Q15 min safety checks maintained. Pt remains safe on the unit.

## 2013-01-10 NOTE — [facility] Group Notes (Signed)
[facility] LCSW Group Therapy  01/10/2013  1:15 PM   Type of Therapy:  Group Therapy  Participation Level:  Active  Participation Quality:  Appropriate and Attentive  Affect:  Appropriate, Blunted  Cognitive:  Alert and Appropriate  Insight:  Developing/Improving and Engaged  Engagement in Therapy:  Developing/Improving and Engaged  Modes of Intervention:  Clarification, Confrontation, Discussion, Education, Exploration, Limit-setting, Orientation, Problem-solving, Rapport Building, Dance movement psychotherapist, Socialization and Support  Summary of Progress/Problems: The topic for group was balance in life.  Today's group focused on defining balance in one's own words, identifying things that can knock one off balance, and exploring healthy ways to maintain balance in life. Group members were asked to provide an example of a time when they felt off balance, describe how they handled that situation,and process healthier ways to regain balance in the future. Group members were asked to share the most important tool for maintaining balance that they learned while at [facility] and how they plan to apply this method after discharge.  Pt appeared to very interested in this topic.  Pt states that his life is unbalanced which leads to anxiety, feeling overwhelmed and procrastination.  Pt states that he tends to isolate when he gets to this point.  Pt states that his goal is to be a productive member of society and get a job after discharging.  Pt actively participated and was engaged in group discussion.   [name], LCSWA 01/10/2013 3:05 PM

## 2013-01-10 NOTE — Progress Notes (Signed)
Recreation Therapy Notes  Date: 10.02.2014 Time: 2:30.2014 Location: 300 [address] Dayroom  Group Topic: Software engineer Activities (AAA)  Behavioral Response: Engaged, Attentive, Appropriate  Affect: Euthymic  Clinical Observations/Feedback: Dog Team: [employer] Patient interacted appropriately with peer, dog team, LRT and MHT.   Marykay Lex Afton Lavalle, LRT/CTRS  Khamiyah Grefe L 01/10/2013 5:18 PM

## 2013-01-10 NOTE — Progress Notes (Signed)
D.  Pt. 's father voiced concerns this evening because pt. Was he said "loopy" in  The day room and could not be aroused.  Pt. Was aroused but not easily.    Father reports that pt. Sounds fine over the phone but in the evenings when he visits pt. Seems to be drowsy and is concerned about the "loopiness" pt. Displays.  Pt. Reports that he is elated with his medications and does not want to have medications changed and states that he sleeps hard when he goes to sleep.   A.  Vitals were taken and were WNL R.  Pt. Attended   and participated in  Group.

## 2013-01-11 DIAGNOSIS — F4310 Post-traumatic stress disorder, unspecified: Secondary | ICD-10-CM

## 2013-01-11 DIAGNOSIS — F1020 Alcohol dependence, uncomplicated: Principal | ICD-10-CM

## 2013-01-11 DIAGNOSIS — F1994 Other psychoactive substance use, unspecified with psychoactive substance-induced mood disorder: Secondary | ICD-10-CM

## 2013-01-11 MED ORDER — INFLUENZA VAC SPLIT QUAD 0.5 ML IM SUSP
0.5000 mL | Freq: Once | INTRAMUSCULAR | Status: AC
  Administered 2013-01-11: 0.5 mL via INTRAMUSCULAR
  Filled 2013-01-11: qty 0.5

## 2013-01-11 MED ORDER — CITALOPRAM HYDROBROMIDE 10 MG PO TABS: 10.0000 mg | ORAL_TABLET | Freq: Every day | ORAL | Status: DC

## 2013-01-11 MED ORDER — BUSPIRONE HCL 10 MG PO TABS: 10.0000 mg | ORAL_TABLET | Freq: Three times a day (TID) | ORAL | Status: DC

## 2013-01-11 MED ORDER — MIRTAZAPINE 30 MG PO TABS: 30.0000 mg | ORAL_TABLET | Freq: Every day | ORAL | Status: DC

## 2013-01-11 MED ORDER — GABAPENTIN 300 MG PO CAPS: 300.0000 mg | ORAL_CAPSULE | Freq: Three times a day (TID) | ORAL | Status: DC

## 2013-01-11 MED ORDER — PROPRANOLOL HCL 10 MG PO TABS: 10.0000 mg | ORAL_TABLET | Freq: Two times a day (BID) | ORAL | Status: DC

## 2013-01-11 MED ORDER — PNEUMOCOCCAL VAC POLYVALENT 25 MCG/0.5ML IJ INJ
0.5000 mL | INJECTION | Freq: Once | INTRAMUSCULAR | Status: AC
  Administered 2013-01-11: 0.5 mL via INTRAMUSCULAR

## 2013-01-11 MED ORDER — TRAZODONE HCL 100 MG PO TABS: 100.0000 mg | ORAL_TABLET | Freq: Every day | ORAL | Status: DC

## 2013-01-11 MED ORDER — QUETIAPINE FUMARATE 50 MG PO TABS: 50.0000 mg | ORAL_TABLET | Freq: Three times a day (TID) | ORAL | Status: DC

## 2013-01-11 NOTE — [facility] Group Notes (Signed)
[facility] LCSW Aftercare Discharge Planning Group Note   01/11/2013 8:45 AM  Participation Quality:  Alert and Appropriate   Mood/Affect:  Appropriate and Bright  Depression Rating:  0  Anxiety Rating:  0  Thoughts of Suicide:  Pt denies SI/HI  Will you contract for safety?   Yes  Current AVH:  Pt denies  Plan for Discharge/Comments:  Pt attended discharge planning group and actively participated in group.  CSW provided pt with today's workbook.  Pt reports feeling stable to d/c today.  Pt states that he feels like a different person since admission and believes the medication regimen he is on is really helpful, and helping him feel so much better.  Pt will follow up at [facility] for medication management and therapy and also has a screening date at [facility] next week.  Pt is able to stay with his father upon d/c.  No further needs voiced by pt at this time.    Transportation Means: Pt reports access to transportation - father will pick pt up  Supports: Father is supportive  [name], LCSWA 01/11/2013 9:18 AM

## 2013-01-11 NOTE — Progress Notes (Signed)
Patient did attend the evening karaoke group. Pt was engaged, supportive, and participated by singing two songs.   

## 2013-01-11 NOTE — Progress Notes (Signed)
Patient discharged per physician order; patient denies SI/HI and A/V hallucinations; patient received samples, prescriptions, and copy of AVS after it was reviewed; patient had no other questions or concerns at this time; patient verbalized and signed that they received all belongings; patient left the unit ambulatory 

## 2013-01-11 NOTE — Progress Notes (Signed)
Adult Psychoeducational Group Note  Date:  01/11/2013 Time:  2:49 PM  Group Topic/Focus:  The activity encouraged patients to choose a letter on the whiteboard and describe a positive quality that begins with that letter.  Participation Level:  Active  Participation Quality:  Appropriate and Attentive  Affect:  Appropriate  Cognitive:  Alert and Appropriate  Insight: Appropriate, Good and Improving  Engagement in Group:  Engaged  Modes of Intervention:  Activity, Education and Support  Additional Comments:  Pt identified the letter A for [employer], "I am a very articulate person; well spoken. I am not afraid to speak in front of a group."  Reynolds Bowl 01/11/2013, 2:49 PM

## 2013-01-11 NOTE — Progress Notes (Signed)
[facility] Adult Case Management Discharge Plan :  Will you be returning to the same living situation after discharge: Yes,  pt verbalizes plan to stay with father At discharge, do you have transportation home?:Yes,  father will pick pt up Do you have the ability to pay for your medications:Yes,  access to meds  Release of information consent forms completed and in the chart;  Patient's signature needed at discharge.  Patient to Follow up at: Follow-up Information   Follow up with [facility] On 01/14/2013. (Walk in for hospital discharge appointment, for medication management and therapy.  Walk in clinic is Monday - Friday 8 am - 3 pm.  )    Contact information:   [address], [place] [address] Phone: [phone] Fax: [phone]      Follow up with [facility] On 01/16/2013. (Arrive at 8:00 am for screening assessment.  Must show that you've resolved court case prior to admission.  )    Contact information:   [address] [place], [place] [address] Phone: [phone] Fax: [phone]      Patient denies SI/HI:   Yes,  denies SI/HI    Safety Planning and Suicide Prevention discussed:  Yes,  discussed with pt and pt's father.  See suicide prevention education note.   Carmina Miller 01/11/2013, 10:42 AM

## 2013-01-11 NOTE — [facility] Suicide Risk Assessment (Signed)
Suicide Risk Assessment  Discharge Assessment     Demographic Factors:  Male, Caucasian, Low socioeconomic status and Unemployed  Mental Status Per Nursing Assessment::   On Admission:  NA  Current Mental Status by Physician: Patient is alert and oriented to 4. Speech normal in rate and volume. Mood improved. Fair insight and judgement. Denies SI/HI/VH/AH.  Loss Factors: Financial problems/change in socioeconomic status  Historical Factors: Family history of mental illness or substance abuse  Risk Reduction Factors:   Positive social support and Positive coping skills or problem solving skills  Continued Clinical Symptoms:  Depression:   Recent sense of peace/wellbeing Alcohol/Substance Abuse/Dependencies  Cognitive Features That Contribute To Risk:  Cognitively intact  Suicide Risk:  Minimal: No identifiable suicidal ideation.  Patients presenting with no risk factors but with morbid ruminations; may be classified as minimal risk based on the severity of the depressive symptoms  Discharge Diagnoses:   AXIS I:  Alcohol Abuse and Depressive Disorder NOS AXIS II:  Deferred AXIS III:   Past Medical History  Diagnosis Date  . Hepatitis C   . Depression   . Anxiety    AXIS IV:  educational problems, occupational problems and other psychosocial or environmental problems AXIS V:  61-70 mild symptoms  Plan Of Care/Follow-up recommendations:  Activity:  as tolerated Diet:  regular  Is patient on multiple antipsychotic therapies at discharge:  No   Has Patient had three or more failed trials of antipsychotic monotherapy by history:  No  Recommended Plan for Multiple Antipsychotic Therapies: NA  Jakin Pavao 01/11/2013, 11:35 AM

## 2013-01-11 NOTE — Tx Team (Signed)
Interdisciplinary Treatment Plan Update (Adult)  Date: 01/11/2013  Time Reviewed:  9:45 AM  Progress in Treatment: Attending groups: Yes Participating in groups:  Yes Taking medication as prescribed:  Yes Tolerating medication:  Yes Family/Significant othe contact made: Yes Patient understands diagnosis:  Yes Discussing patient identified problems/goals with staff:  Yes Medical problems stabilized or resolved:  Yes Denies suicidal/homicidal ideation: Yes Issues/concerns per patient self-inventory:  Yes Other:  New problem(s) identified: N/A  Discharge Plan or Barriers: Pt will follow up at [facility] medication management and therapy and screening for [facility] for further treatment.    Reason for Continuation of Hospitalization: Stable to d/c  Comments: N/A  Estimated length of stay: D/C today  For review of initial/current patient goals, please see plan of care.  Attendees: Patient:     Family:     Physician:  Dr. Daleen Bo 01/11/2013 10:36 AM   Nursing:   Burnetta Sabin, RN 01/11/2013 10:36 AM   Clinical Social Worker:  [name], LCSWA 01/11/2013 10:36 AM   Other: Onnie Boer, RN case manager 01/11/2013 10:36 AM   Other:  Trula Slade, LCSWA 01/11/2013 10:36 AM   Other:  Serena Colonel, NP 01/11/2013 10:36 AM   Other:  Tomasita Morrow, care manager 01/11/2013 10:36 AM   Other: Onnie Boer, RN case manager 01/11/2013 10:37 AM   Other: Mardella Layman, RN 01/11/2013 10:37 AM   Other:    Other:    Other:    Other:     Scribe for Treatment Team:   Carmina Miller, 01/11/2013 , 10:36 AM

## 2013-01-11 NOTE — Progress Notes (Signed)
Recreation Therapy Notes  Date: 10.02.2014 Time: 3:00pm Location: [address] Dayroom   Group Topic: Communication, Team Building, Problem Solving  Goal Area(s) Addresses:  Patient will effectively work with peers towards shared goal.  Patient will identify skill used to make activity successful.  Patient will identify how skills used during activity can be used to reach post d/c goals.   Behavioral Response: Engaged, Attentive, Appropriate  Intervention: Problem Solving Activity  Activity: Landing Pad. In teams patients were given 12 plastic drinking straws and a length of masking tape. Using the materials provided patients were asked to build a landing pad to catch a golf ball dropped from approximately 6 feet in the air.   Education: Special educational needs teacher, Team Work, Building control surveyor.   Education Outcome: Acknowledges understanding.   Clinical Observations/Feedback: Patient actively engaged in group activity, working well with his team. Patient offered suggestions to team mates, as well as listening to their ideas. Additionally patient assisted in building team landing pad. Patient contributed to group discussion, identifying skills used during group session as well as accurately applying concepts to his life post d/c.   Marykay Lex Raizel Wesolowski, LRT/CTRS  Alexei Ey L 01/11/2013 8:41 AM

## 2013-01-11 NOTE — [facility] Group Notes (Signed)
[facility] LCSW Group Therapy  01/11/2013  1:15 PM   Type of Therapy:  Group Therapy  Participation Level:  Active  Participation Quality:  Appropriate and Attentive  Affect:  Appropriate and Calm  Cognitive:  Alert and Appropriate  Insight:  Developing/Improving and Engaged  Engagement in Therapy:  Developing/Improving and Engaged  Modes of Intervention:  Clarification, Confrontation, Discussion, Education, Exploration, Limit-setting, Orientation, Problem-solving, Rapport Building, Dance movement psychotherapist, Socialization and Support  Summary of Progress/Problems: The topic for today was feelings about relapse.  Pt discussed what relapse prevention is to them and identified triggers that they are on the path to relapse.  Pt processed their feeling towards relapse and was able to relate to peers.  Pt discussed coping skills that can be used for relapse prevention.   Pt appeared to be interested in the group discussion.  Pt states that relapse for him is when he uses and has anxiety.  Pt was able to identify that his triggers to use are isolating himself and finances.  Pt was able to identify coping skills he can utilize as taking a hot bath.  Pt plans to seek employment to keep him busy and build his self esteem.  Pt actively participated and engaged in group discussion.    [name], [place] 01/11/2013 2:38 PM

## 2013-01-11 NOTE — Discharge Summary (Signed)
Physician Discharge Summary Note  Patient:  [name] is an 22 y.o., male MRN:  [id number] DOB:  [birth year] Patient phone:  [phone] (home)  Patient address:   [address] [place] [address],   Date of Admission:  01/04/2013 Date of Discharge: 01/11/13  Reason for Admission:  Alcohol detox  Discharge Diagnoses: Active Problems:   Panic attacks   PTSD (post-traumatic stress disorder)   Alcohol abuse  Review of Systems  Constitutional: Negative.   HENT: Negative.   Eyes: Negative.   Respiratory: Negative.   Cardiovascular: Negative.   Gastrointestinal: Negative.   Genitourinary: Negative.   Musculoskeletal: Negative.   Skin: Negative.   Neurological: Negative.   Endo/Heme/Allergies: Negative.   Psychiatric/Behavioral: Positive for depression (Stabilized with medication prior to discharge) and substance abuse (Cocaine dependence, cannabis dependence). Negative for suicidal ideas, hallucinations and memory loss. The patient has insomnia (Stabilized with medication prior to discharge).     DSM5: Schizophrenia Disorders:  NA Obsessive-Compulsive Disorders:  NA Trauma-Stressor Disorders:  Posttraumatic Stress Disorder (309.81) Substance/Addictive Disorders:  Alcohol Related Disorder - Severe (303.90) Depressive Disorders: Substance induced mood disorder  Axis Diagnosis:  AXIS I:  Alcohol dependence, Substance induced mood disorder, PTSD AXIS II:  Deferred AXIS III:   Past Medical History  Diagnosis Date  . Hepatitis C   . Depression   . Anxiety    AXIS IV:  economic problems, other psychosocial or environmental problems and Polysubstance dependence issues AXIS V:  62  Level of Care:  RTC, OP  Hospital Course: [name] is a 22 year old single, white, unemployed male who presented to the emergency department with complaints of suicidal thoughts to ingest bleach or drain cleaner, as well as complaints of excessive alcohol consumption, and cocaine and  marijuana use. Today he reports that he has a long history of anxiety with panic attacks, and the other day during a panic attack he "tore up the house." He endorses drinking approximately 6 beers daily, smoking marijuana once or twice weekly, and has recently used cocaine one time. He has a history of opiate addiction for which she has received treatment on methadone, Suboxone, and residential treatment at [facility] in [place], [place]. He reports that his current substance use is an attempt to self medicate his anxiety.   [name] came into the hospital with UDS report positive cocaine and THC. He also admits to drinking a lot of alcohol. He was tensed up throughout his hospitalization reporting increased anxiety and inability to sleep. He was started on Librium detoxification treatment protocols to combat his withdrawals symptoms. He was also prescribed and received Buspar 10 mg tid for anxiety, Gabapentin 300 mg tid for anxiety/pain management, Mirtazapine 30 mg Q bedtime for depression/sleep, Propranolol 10 mg bid for anxiety, Seroquel 50 mg tid for anxiety/mood control and Trazodone 100 mg Q bedtime for sleep.  [name] was also enrolled in Group counseling sessions and activities where he was counseled and learned coping skills that should help him cope better and manage his substance abuse issues for a much longer sobriety. [name] attended treatment team meeting this am and met with his treatment team staff. His reason for admission, present symptoms, substance abuse issues, response to treatment and discharge plans discussed. [name] endorsed that he is doing well, and stabilized to seek residential treatment for his substance abuse issues and a routine psychiatric care on an outpatient basis. It was then agreed upon that patient will follow-up care at [facility] psychiatric clinic here in [place], [place] on  01/14/13. He has been informed that this is a walk-in appointment between the hours  of 08-09:00 am Monday through Friday.He also has an appointment at the [facility] on 01/16/13 at 08:00 am for admission assessment/screening. The addresses, dates, times and contact information for these appointments provided for patient in writing.  Upon discharge, patient adamantly denies suicidal, homicidal ideations, auditory, Visual hallucinations, delusional thoughts and or withdrawal symptoms. He was provided with 14 days worth supply samples of his [facility] discharge medications. He left [facility] with all personal belongings via family transport.  Consults:  psychiatry  Significant Diagnostic Studies:  labs: CBC with diff, CMP, UDS, Toxicology tests, U/A  Discharge Vitals:   Blood pressure 138/84, pulse 93, temperature 97.1 F (36.2 C), temperature source Oral, resp. rate 16, height 6\' 6"  (1.981 m), weight 57.153 kg (126 lb), SpO2 97.00%. Body mass index is 14.56 kg/(m^2). Lab Results:   No results found for this or any previous visit (from the past 72 hour(s)).  Physical Findings: AIMS: Facial and Oral Movements Muscles of Facial Expression: None, normal Lips and Perioral Area: None, normal Jaw: None, normal Tongue: None, normal,Extremity Movements Upper (arms, wrists, hands, fingers): None, normal Lower (legs, knees, ankles, toes): None, normal, Trunk Movements Neck, shoulders, hips: None, normal, Overall Severity Severity of abnormal movements (highest score from questions above): None, normal Incapacitation due to abnormal movements: None, normal Patient's awareness of abnormal movements (rate only patient's report): No Awareness, Dental Status Current problems with teeth and/or dentures?: No Does patient usually wear dentures?: No  CIWA:  CIWA-Ar Total: 0 COWS:  COWS Total Score: 7  Psychiatric Specialty Exam: See Psychiatric Specialty Exam and Suicide Risk Assessment completed by Attending Physician prior to discharge.  Discharge destination:  Other:  Home, then to  [facility] 01/16/13  Is patient on multiple antipsychotic therapies at discharge:  No   Has Patient had three or more failed trials of antipsychotic monotherapy by history:  No  Recommended Plan for Multiple Antipsychotic Therapies: NA     Medication List       Indication   busPIRone 10 MG tablet  Commonly known as:  BUSPAR  Take 1 tablet (10 mg total) by mouth 3 (three) times daily. For anxiety   Indication:  Anxiety Disorder, Generalized Anxiety Disorder     citalopram 10 MG tablet  Commonly known as:  CELEXA  Take 1 tablet (10 mg total) by mouth daily. For depression   Indication:  Depression     gabapentin 300 MG capsule  Commonly known as:  NEURONTIN  Take 1 capsule (300 mg total) by mouth 3 (three) times daily. For anxiety   Indication:  Agitation, Anxiety     mirtazapine 30 MG tablet  Commonly known as:  REMERON  Take 1 tablet (30 mg total) by mouth at bedtime. For depression/sleep   Indication:  Trouble Sleeping, Major Depressive Disorder     propranolol 10 MG tablet  Commonly known as:  INDERAL  Take 1 tablet (10 mg total) by mouth 2 (two) times daily. For anxiety   Indication:  agitation, anxiety     QUEtiapine 50 MG tablet  Commonly known as:  SEROQUEL  Take 1 tablet (50 mg total) by mouth 3 (three) times daily. For mood control/anxiety   Indication:  Mood control/anxiety     traZODone 100 MG tablet  Commonly known as:  DESYREL  Take 1 tablet (100 mg total) by mouth at bedtime. For sleep   Indication:  Trouble Sleeping  Follow-up Information   Follow up with [facility] On 01/14/2013. (Walk in for hospital discharge appointment, for medication management and therapy.  Walk in clinic is Monday - Friday 8 am - 3 pm.  )    Contact information:   [address], [place] [address] Phone: [phone] Fax: [phone]      Follow up with [facility] On 01/16/2013. (Arrive at 8:00 am for screening assessment.  Must show that you've  resolved court case prior to admission.  )    Contact information:   [address] [place], [place] [address] Phone: [phone] Fax: [phone]     Follow-up recommendations:  Activity:  As tolerated Diet: As recommended by your primary care doctor. Keep all scheduled follow-up appointments as recommended.  Comments:  Take all your medications as prescribed by your mental healthcare provider. Report any adverse effects and or reactions from your medicines to your outpatient provider promptly. Patient is instructed and cautioned to not engage in alcohol and or illegal drug use while on prescription medicines. In the event of worsening symptoms, patient is instructed to call the crisis hotline, 911 and or go to the nearest ED for appropriate evaluation and treatment of symptoms. Follow-up with your primary care provider for your other medical issues, concerns and or health care needs.   Total Discharge Time:  Greater than 30 minutes.  Signed: Armandina Stammer I, PMHNP-BC 01/11/2013, 12:00 PM

## 2013-01-15 NOTE — Progress Notes (Signed)
Patient Discharge Instructions:  After Visit Summary (AVS):   Faxed to:  01/15/13 Discharge Summary Note:   Faxed to:  01/15/13 Psychiatric Admission Assessment Note:   Faxed to:  01/15/13 Suicide Risk Assessment - Discharge Assessment:   Faxed to:  01/15/13 Faxed/Sent to the Next Level Care provider:  01/15/13 Faxed to [facility] @ [phone] Faxed to [facility] @ [phone]  Jerelene Redden, 01/15/2013, 4:05 PM

## 2013-01-25 ENCOUNTER — Encounter (HOSPITAL_COMMUNITY): Payer: Self-pay | Admitting: Emergency Medicine

## 2013-01-25 ENCOUNTER — Emergency Department (HOSPITAL_COMMUNITY)
Admission: EM | Admit: 2013-01-25 | Discharge: 2013-01-26 | Disposition: A | Discharge status: Other Acute Inpatient Hospital | Payer: Self-pay | Attending: Emergency Medicine | Admitting: Emergency Medicine

## 2013-01-25 DIAGNOSIS — X789XXA Intentional self-harm by unspecified sharp object, initial encounter: Secondary | ICD-10-CM | POA: Insufficient documentation

## 2013-01-25 DIAGNOSIS — S51809A Unspecified open wound of unspecified forearm, initial encounter: Secondary | ICD-10-CM | POA: Insufficient documentation

## 2013-01-25 DIAGNOSIS — R4589 Other symptoms and signs involving emotional state: Secondary | ICD-10-CM

## 2013-01-25 DIAGNOSIS — R45851 Suicidal ideations: Secondary | ICD-10-CM | POA: Insufficient documentation

## 2013-01-25 DIAGNOSIS — F3289 Other specified depressive episodes: Secondary | ICD-10-CM | POA: Insufficient documentation

## 2013-01-25 DIAGNOSIS — F411 Generalized anxiety disorder: Secondary | ICD-10-CM | POA: Insufficient documentation

## 2013-01-25 DIAGNOSIS — F329 Major depressive disorder, single episode, unspecified: Secondary | ICD-10-CM | POA: Insufficient documentation

## 2013-01-25 LAB — COMPREHENSIVE METABOLIC PANEL
ALT: 236 U/L — ABNORMAL HIGH (ref 0–53)
AST: 136 U/L — ABNORMAL HIGH (ref 0–37)
Albumin: 4 g/dL (ref 3.5–5.2)
Alkaline Phosphatase: 65 U/L (ref 39–117)
CO2: 27 mEq/L (ref 19–32)
Calcium: 9.2 mg/dL (ref 8.4–10.5)
Chloride: 105 mEq/L (ref 96–112)
Creatinine, Ser: 0.78 mg/dL (ref 0.50–1.35)
GFR calc Af Amer: >90 mL/min (ref >90)
GFR calc non Af Amer: >90 mL/min (ref >90)
Glucose, Bld: 89 mg/dL (ref 70–99)
Potassium: 4.4 mEq/L (ref 3.5–5.1)
Sodium: 142 mEq/L (ref 135–145)
Total Bilirubin: 0.7 mg/dL (ref 0.3–1.2)

## 2013-01-25 LAB — RAPID URINE DRUG SCREEN, HOSP PERFORMED
Amphetamines: NOT DETECTED
Barbiturates: NOT DETECTED
Benzodiazepines: NOT DETECTED
Cocaine: NOT DETECTED
Tetrahydrocannabinol: NOT DETECTED

## 2013-01-25 LAB — CBC
Hemoglobin: 13.6 g/dL (ref 13.0–17.0)
MCH: 31.1 pg (ref 26.0–34.0)
RBC: 4.38 MIL/uL (ref 4.22–5.81)
RDW: 13.5 % (ref 11.5–15.5)
WBC: 6.5 10*3/uL (ref 4.0–10.5)

## 2013-01-25 LAB — SALICYLATE LEVEL: Salicylate Lvl: <2 mg/dL — ABNORMAL LOW (ref 2.8–20.0)

## 2013-01-25 MED ORDER — TRAZODONE HCL 50 MG PO TABS
100.0000 mg | ORAL_TABLET | Freq: Every day | ORAL | Status: DC
  Administered 2013-01-25: 100 mg via ORAL
  Filled 2013-01-25: qty 2

## 2013-01-25 MED ORDER — BUSPIRONE HCL 10 MG PO TABS
10.0000 mg | ORAL_TABLET | Freq: Three times a day (TID) | ORAL | Status: DC
  Administered 2013-01-25 (×2): 10 mg via ORAL
  Filled 2013-01-25 (×2): qty 1

## 2013-01-25 MED ORDER — GABAPENTIN 300 MG PO CAPS
300.0000 mg | ORAL_CAPSULE | Freq: Three times a day (TID) | ORAL | Status: DC
  Administered 2013-01-25: 300 mg via ORAL
  Filled 2013-01-25: qty 1

## 2013-01-25 MED ORDER — CITALOPRAM HYDROBROMIDE 10 MG PO TABS
10.0000 mg | ORAL_TABLET | Freq: Every day | ORAL | Status: DC
  Administered 2013-01-25: 10 mg via ORAL
  Filled 2013-01-25: qty 1

## 2013-01-25 MED ORDER — MIRTAZAPINE 30 MG PO TABS
30.0000 mg | ORAL_TABLET | Freq: Every day | ORAL | Status: DC
  Administered 2013-01-25: 30 mg via ORAL
  Filled 2013-01-25 (×2): qty 1

## 2013-01-25 MED ORDER — QUETIAPINE FUMARATE 25 MG PO TABS
50.0000 mg | ORAL_TABLET | Freq: Three times a day (TID) | ORAL | Status: DC
  Administered 2013-01-25 (×2): 50 mg via ORAL
  Filled 2013-01-25 (×2): qty 2

## 2013-01-25 MED ORDER — PROPRANOLOL HCL 10 MG PO TABS
10.0000 mg | ORAL_TABLET | Freq: Two times a day (BID) | ORAL | Status: DC
  Administered 2013-01-25: 10 mg via ORAL
  Filled 2013-01-25 (×2): qty 1

## 2013-01-25 NOTE — ED Notes (Signed)
Pt here for suicide attempt while at [facility] today; pt cut his wrist today not in attempt of suicide but to feel better and then attempted to drink bleach although denies drinking any; pt sts he is sober x 1.5 months but having increasing trouble with panic attacks and depression; pt sts taking meds; lacerations to wrist bandaged by [facility] staff

## 2013-01-25 NOTE — ED Notes (Signed)
Belongings given to driver/transporter including money in sealed envelope
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
## 2013-01-25 NOTE — ED Notes (Signed)
Pt moved to pod C room 21

## 2013-01-25 NOTE — ED Provider Notes (Signed)
CSN: [id number]     Arrival date & time 01/25/13  1720 History   First MD Initiated Contact with Patient 01/25/13 1732     Chief Complaint  Patient presents with  . Medical Clearance  . Suicidal   (Consider location/radiation/quality/duration/timing/severity/associated sxs/prior Treatment) HPI Patient has history of depression and anxiety the past 10 years. He had multiple different medications states that they do not help. He is currently a day Vernia Buff and states he has been sober from alcohol for close to 2 months. He admits to increasing depression and self cutting to his right forearm today. States he decided that he wanted to kill himself and was attempting to drink bleach that was stopped. Patient denies any bleach or other substance intake. He denies any current pain, shortness of breath or chest pain. Continues to have suicidal thoughts. Denies any homicidal thoughts or hallucinations. Past Medical History  Diagnosis Date  . Hepatitis C   . Depression   . Anxiety    Past Surgical History  Procedure Laterality Date  . Extraction of wisdom teeth  Age 4   Family History  Problem Relation Age of Onset  . Anxiety disorder Father   . Drug abuse Father   . Anxiety disorder Paternal Grandmother   . Bipolar disorder Paternal Aunt   . Alcohol abuse Paternal Aunt   . Anxiety disorder Paternal Uncle    History  Substance Use Topics  . Smoking status: Current Every Day Smoker -- 1.00 packs/day    Types: Cigarettes  . Smokeless tobacco: Not on file  . Alcohol Use: 3.6 oz/week    6 Glasses of wine, 7-10 Cans of beer per week     Comment: 4 to 5 times a week     Review of Systems  Constitutional: Negative for fever and chills.  Respiratory: Negative for cough and shortness of breath.   Cardiovascular: Negative for chest pain.  Gastrointestinal: Negative for nausea, vomiting and abdominal pain.  Musculoskeletal: Negative for back pain, myalgias and neck pain.  Skin: Positive for  wound. Negative for rash.  Neurological: Negative for dizziness, tremors, weakness, light-headedness, numbness and headaches.  Psychiatric/Behavioral: Positive for suicidal ideas, self-injury and dysphoric mood. Negative for hallucinations.  All other systems reviewed and are negative.    Allergies  Review of patient's allergies indicates no known allergies.  Home Medications   Current Outpatient Rx  Name  Route  Sig  Dispense  Refill  . busPIRone (BUSPAR) 10 MG tablet   Oral   Take 1 tablet (10 mg total) by mouth 3 (three) times daily. For anxiety   90 tablet   0   . citalopram (CELEXA) 10 MG tablet   Oral   Take 1 tablet (10 mg total) by mouth daily. For depression   30 tablet   0   . gabapentin (NEURONTIN) 300 MG capsule   Oral   Take 1 capsule (300 mg total) by mouth 3 (three) times daily. For anxiety   90 capsule   0   . mirtazapine (REMERON) 30 MG tablet   Oral   Take 1 tablet (30 mg total) by mouth at bedtime. For depression/sleep   30 tablet   0   . propranolol (INDERAL) 10 MG tablet   Oral   Take 1 tablet (10 mg total) by mouth 2 (two) times daily. For anxiety   60 tablet   0   . QUEtiapine (SEROQUEL) 50 MG tablet   Oral   Take 1 tablet (50 mg  total) by mouth 3 (three) times daily. For mood control/anxiety   90 tablet   0   . traZODone (DESYREL) 100 MG tablet   Oral   Take 1 tablet (100 mg total) by mouth at bedtime. For sleep   30 tablet   0    BP 125/78  Pulse 103  Temp(Src) 98.5 F (36.9 C) (Oral)  Resp 19  Wt 147 lb (66.679 kg)  BMI 16.99 kg/m2  SpO2 96% Physical Exam  Nursing note and vitals reviewed. Constitutional: He is oriented to person, place, and time. He appears well-developed and well-nourished. No distress.  HENT:  Head: Normocephalic and atraumatic.  Mouth/Throat: Oropharynx is clear and moist.  Eyes: EOM are normal. Pupils are equal, round, and reactive to light.  Neck: Normal range of motion. Neck supple.   Cardiovascular: Normal rate and regular rhythm.   Pulmonary/Chest: Effort normal and breath sounds normal. No respiratory distress. He has no wheezes. He has no rales.  Abdominal: Soft. Bowel sounds are normal. He exhibits no distension and no mass. There is no tenderness. There is no rebound and no guarding.  Musculoskeletal: Normal range of motion. He exhibits no edema and no tenderness.  Neurological: He is alert and oriented to person, place, and time.  Skin: Skin is warm and dry. No rash noted. No erythema.  Patient with multiple superficial linear laceration to the ventral surface of his right forearm. No active bleeding. 2+ distal pulses  Psychiatric:  Depressed mood. Flat affect. Admits to suicidal ideation.    ED Course  Procedures (including critical care time) Labs Review Labs Reviewed  ACETAMINOPHEN LEVEL  CBC  COMPREHENSIVE METABOLIC PANEL  ETHANOL  SALICYLATE LEVEL  URINE RAPID DRUG SCREEN (HOSP PERFORMED)   Imaging Review No results found.  EKG Interpretation   None       MDM  We'll dress wound. Patient will likely need to be admitted for suicidal ideation with plan. Patient's medically cleared for psychiatric evaluation.  Loren Racer, MD 01/26/13 2330

## 2013-01-25 NOTE — ED Notes (Signed)
Pt attempted suicide 2 wks ago, he also attempted suicide today. Pt states he planned to drink bleach but someone walked in and he didn't actually drink any. Pt states he is tired of being here, tired of taking medications and dealing with anxiety.

## 2013-01-26 ENCOUNTER — Inpatient Hospital Stay (HOSPITAL_COMMUNITY)
Admission: AD | Admit: 2013-01-26 | Discharge: 2013-01-30 | DRG: 885 | Disposition: A | Discharge status: Home / Self Care | Payer: Federal, State, Local not specified - Other | Source: Intra-hospital | Attending: Psychiatry | Admitting: Psychiatry

## 2013-01-26 ENCOUNTER — Encounter (HOSPITAL_COMMUNITY): Payer: Self-pay | Admitting: *Deleted

## 2013-01-26 DIAGNOSIS — F332 Major depressive disorder, recurrent severe without psychotic features: Principal | ICD-10-CM | POA: Diagnosis present

## 2013-01-26 DIAGNOSIS — F322 Major depressive disorder, single episode, severe without psychotic features: Secondary | ICD-10-CM

## 2013-01-26 DIAGNOSIS — B1920 Unspecified viral hepatitis C without hepatic coma: Secondary | ICD-10-CM | POA: Diagnosis present

## 2013-01-26 DIAGNOSIS — F4310 Post-traumatic stress disorder, unspecified: Secondary | ICD-10-CM

## 2013-01-26 DIAGNOSIS — R45851 Suicidal ideations: Secondary | ICD-10-CM

## 2013-01-26 DIAGNOSIS — Z79899 Other long term (current) drug therapy: Secondary | ICD-10-CM

## 2013-01-26 DIAGNOSIS — F411 Generalized anxiety disorder: Secondary | ICD-10-CM | POA: Diagnosis present

## 2013-01-26 DIAGNOSIS — F329 Major depressive disorder, single episode, unspecified: Secondary | ICD-10-CM | POA: Diagnosis present

## 2013-01-26 DIAGNOSIS — F1021 Alcohol dependence, in remission: Secondary | ICD-10-CM

## 2013-01-26 DIAGNOSIS — F410 Panic disorder [episodic paroxysmal anxiety] without agoraphobia: Secondary | ICD-10-CM

## 2013-01-26 DIAGNOSIS — R748 Abnormal levels of other serum enzymes: Secondary | ICD-10-CM | POA: Insufficient documentation

## 2013-01-26 MED ORDER — TRAZODONE HCL 50 MG PO TABS: 50.0000 mg | ORAL_TABLET | Freq: Every evening | ORAL | Status: DC | PRN

## 2013-01-26 MED ORDER — LORAZEPAM 1 MG PO TABS: 1.0000 mg | ORAL_TABLET | Freq: Three times a day (TID) | ORAL | Status: DC | PRN

## 2013-01-26 MED ORDER — MAGNESIUM HYDROXIDE 400 MG/5ML PO SUSP: 30.0000 mL | Freq: Every day | ORAL | Status: DC | PRN

## 2013-01-26 MED ORDER — IBUPROFEN 600 MG PO TABS: 600.0000 mg | ORAL_TABLET | Freq: Three times a day (TID) | ORAL | Status: DC | PRN

## 2013-01-26 MED ORDER — QUETIAPINE FUMARATE 50 MG PO TABS
50.0000 mg | ORAL_TABLET | Freq: Three times a day (TID) | ORAL | Status: DC
  Administered 2013-01-26 – 2013-01-28 (×6): 50 mg via ORAL
  Filled 2013-01-26 (×15): qty 1

## 2013-01-26 MED ORDER — ACETAMINOPHEN 325 MG PO TABS: 650.0000 mg | ORAL_TABLET | Freq: Four times a day (QID) | ORAL | Status: DC | PRN

## 2013-01-26 MED ORDER — CITALOPRAM HYDROBROMIDE 10 MG PO TABS
10.0000 mg | ORAL_TABLET | Freq: Every day | ORAL | Status: DC
  Administered 2013-01-26 – 2013-01-28 (×3): 10 mg via ORAL
  Filled 2013-01-26 (×5): qty 1

## 2013-01-26 MED ORDER — GABAPENTIN 300 MG PO CAPS
300.0000 mg | ORAL_CAPSULE | Freq: Three times a day (TID) | ORAL | Status: DC
  Administered 2013-01-26 – 2013-01-28 (×7): 300 mg via ORAL
  Filled 2013-01-26 (×14): qty 1

## 2013-01-26 MED ORDER — TRAZODONE HCL 100 MG PO TABS
100.0000 mg | ORAL_TABLET | Freq: Every day | ORAL | Status: DC
  Administered 2013-01-26: 100 mg via ORAL
  Filled 2013-01-26 (×3): qty 1

## 2013-01-26 MED ORDER — ONDANSETRON HCL 4 MG PO TABS: 4.0000 mg | ORAL_TABLET | Freq: Three times a day (TID) | ORAL | Status: DC | PRN

## 2013-01-26 MED ORDER — PROPRANOLOL HCL 10 MG PO TABS
10.0000 mg | ORAL_TABLET | Freq: Two times a day (BID) | ORAL | Status: DC
  Administered 2013-01-26 – 2013-01-28 (×5): 10 mg via ORAL
  Filled 2013-01-26 (×9): qty 1

## 2013-01-26 MED ORDER — BUSPIRONE HCL 10 MG PO TABS
10.0000 mg | ORAL_TABLET | Freq: Three times a day (TID) | ORAL | Status: DC
  Administered 2013-01-26 – 2013-01-28 (×7): 10 mg via ORAL
  Filled 2013-01-26 (×12): qty 1
  Filled 2013-01-26: qty 2
  Filled 2013-01-26: qty 1

## 2013-01-26 MED ORDER — MIRTAZAPINE 30 MG PO TABS
30.0000 mg | ORAL_TABLET | Freq: Every day | ORAL | Status: DC
  Administered 2013-01-26: 30 mg via ORAL
  Filled 2013-01-26 (×3): qty 1

## 2013-01-26 MED ORDER — NICOTINE 21 MG/24HR TD PT24
21.0000 mg | MEDICATED_PATCH | Freq: Every day | TRANSDERMAL | Status: DC
  Administered 2013-01-26 – 2013-01-29 (×4): 21 mg via TRANSDERMAL
  Filled 2013-01-26 (×7): qty 1

## 2013-01-26 MED ORDER — ALUM & MAG HYDROXIDE-SIMETH 200-200-20 MG/5ML PO SUSP: 30.0000 mL | ORAL | Status: DC | PRN

## 2013-01-26 NOTE — Progress Notes (Signed)
Writer has observed patient sitting in the dayroom playing cards with select peers and interacting appropriately. Patient attended wrap up group this evening and participated. Patient reports that his goal is to get his depression medication corrected/adjusted so that he doesn't feel so down and depressed. Patient currently denies si/hi/a/v hallucinations. Support and encouragement offered, safety maintained on unit, will continue to monitor.

## 2013-01-26 NOTE — Tx Team (Signed)
Initial Interdisciplinary Treatment Plan  PATIENT STRENGTHS: (choose at least two) Ability for insight Average or above average intelligence Capable of independent living Motivation for treatment/growth  PATIENT STRESSORS: Medication change or noncompliance   PROBLEM LIST: Problem List/Patient Goals Date to be addressed Date deferred Reason deferred Estimated date of resolution  Depression 01/26/13     Suicidal Ideation 01/26/13                                                DISCHARGE CRITERIA:  Ability to meet basic life and health needs Improved stabilization in mood, thinking, and/or behavior Verbal commitment to aftercare and medication compliance  PRELIMINARY DISCHARGE PLAN: Attend aftercare/continuing care group  PATIENT/FAMIILY INVOLVEMENT: This treatment plan has been presented to and reviewed with the patient, [name], and/or family member, .  The patient and family have been given the opportunity to ask questions and make suggestions.  Brentley Horrell, [place] 01/26/2013, 12:58 AM

## 2013-01-26 NOTE — Progress Notes (Signed)
.  Psychoeducational Group Note    Date: 01/26/2013 Time:  0930   Goal Setting Purpose of Group: To be able to set a goal that is measurable and that can be accomplished in one day Participation Level:  Active  Participation Quality:  Attentive  Affect:  Appropriate  Cognitive:  Oriented  Insight:  Improving  Engagement in Group:  Engaged  Additional Comments:    [name] 

## 2013-01-26 NOTE — Progress Notes (Signed)
Adult Psychoeducational Group Note  Date:  01/26/2013 Time:  8:00 pm  Group Topic/Focus:  Wrap-Up Group:   The focus of this group is to help patients review their daily goal of treatment and discuss progress on daily workbooks.  Participation Level:  Active  Participation Quality:  Appropriate and Sharing  Affect:  Appropriate  Cognitive:  Appropriate  Insight: Appropriate  Engagement in Group:  Engaged  Modes of Intervention:  Discussion, Education, Socialization and Support  Additional Comments:  Pt stated that he is in the hospital for suicidal ideations. Pt identified that he is easy to talk to and that he is articulate most of the time.   Laural Benes, Kayleb Warshaw 01/26/2013, 9:24 PM
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
## 2013-01-26 NOTE — [facility] Suicide Risk Assessment (Signed)
Suicide Risk Assessment  Admission Assessment     Nursing information obtained from:    Demographic factors:    Current Mental Status:    Loss Factors:    Historical Factors:    Risk Reduction Factors:     CLINICAL FACTORS:   Depression:   Comorbid alcohol abuse/dependence  COGNITIVE FEATURES THAT CONTRIBUTE TO RISK:  Closed-mindedness Polarized thinking Thought constriction (tunnel vision)    SUICIDE RISK:   Moderate:  Frequent suicidal ideation with limited intensity, and duration, some specificity in terms of plans, no associated intent, good self-control, limited dysphoria/symptomatology, some risk factors present, and identifiable protective factors, including available and accessible social support.  PLAN OF CARE: Supportive approach/coping skills/relapse prevention                               Reassess his medications  I certify that inpatient services furnished can reasonably be expected to improve the patient's condition.  Lugenia Assefa A 01/26/2013, 6:39 PM

## 2013-01-26 NOTE — [facility] Group Notes (Signed)
[facility] Group Notes: (Clinical Social Work)   01/26/2013      Type of Therapy:  Group Therapy   Participation Level:  Did Not Attend    Ambrose Mantle, LCSW 01/26/2013, 5:01 PM

## 2013-01-26 NOTE — Progress Notes (Signed)
22 year old male pt admitted on voluntary basis. On admission, pt reports that he left [facility] a couple weeks ago and since then he says his medications have been changed and states that since his medications have been changed he has not been feeling right. Pt reports that he is waiting to get into [facility] which he states is like a halfway house. Pt does have past history of polysubstance abuse but reports that he has been clean and sober since he left here earlier this month. Pt does report that he is currently unemployed and having financial stressors. Pt does have passive SI on admission but able to contract for safety on the unit. Pt was oriented to unit and safety maintained.

## 2013-01-27 MED ORDER — QUETIAPINE FUMARATE 100 MG PO TABS
100.0000 mg | ORAL_TABLET | Freq: Every day | ORAL | Status: DC
  Administered 2013-01-27: 100 mg via ORAL
  Filled 2013-01-27 (×3): qty 1

## 2013-01-27 MED ORDER — MIRTAZAPINE 15 MG PO TABS
15.0000 mg | ORAL_TABLET | Freq: Every day | ORAL | Status: DC
  Administered 2013-01-27: 15 mg via ORAL
  Filled 2013-01-27 (×4): qty 1

## 2013-01-27 NOTE — Progress Notes (Signed)
Psychoeducational Group Note  Date: 01/27/2013 Time:  0930  Group Topic/Focus:  Gratefulness:  The focus of this group is to help patients identify what two things they are most grateful for in their lives. What helps ground them and to center them on their work to their recovery.  Participation Level:  Active  Participation Quality:  Appropriate  Affect:  Appropriate  Cognitive:  Oriented  Insight:  Improving  Engagement in Group:  Improving  Additional Comments:    Lanell Carpenter A   
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
## 2013-01-27 NOTE — Progress Notes (Signed)
[facility] Group Notes:  (Nursing/MHT/Case Management/Adjunct)  Date:  01/27/2013  Time:  2:37 PM  Type of Therapy:  Psychoeducational Skills  Participation Level:  Active  Participation Quality:  Appropriate  Affect:  Appropriate  Cognitive:  Appropriate  Insight:  Appropriate  Engagement in Group:  Engaged  Modes of Intervention:  Activity, Socialization and Support  Summary of Progress/Problems: Pt attended group and was appropriate.  Roseland Braun C 01/27/2013, 2:37 PM 

## 2013-01-27 NOTE — Progress Notes (Signed)
Psychoeducational Group Note  Date:  01/27/2013 Time:  1015  Group Topic/Focus:  Making Healthy Choices:   The focus of this group is to help patients identify negative/unhealthy choices they were using prior to admission and identify positive/healthier coping strategies to replace them upon discharge.  Participation Level:  Active  Participation Quality:  Appropriate  Affect:  Appropriate  Cognitive:  Oriented  Insight:  Improving  Engagement in Group:  Engaged  Additional Comments:    Mikaella Escalona A 01/27/2013  

## 2013-01-27 NOTE — [facility] Group Notes (Signed)
[facility] Group Notes:  (Clinical Social Work)  01/27/2013   3:00-4:00PM  Summary of Progress/Problems:   The main focus of today's process group was to   identify the patient's current support system and decide on other supports that can be put in place.  The picture on workbook was used to discuss why additional supports are needed, and a hand-out was distributed with four definitions/levels of support, then used to talk about how patients have given and received all different kinds of support.  An emphasis was placed on using counselor, doctor, therapy groups, 12-step groups, and problem-specific support groups to expand supports.  The patient expressed full comprehension of the concepts presented, and agreed that there is a need to add more supports.  He stated he feels more comfortable around females and around older people than people, especially males, his own age.  He mentioned craft classes as a possible means of fun support in addition to the professional supports.  Type of Therapy:  Process Group  Participation Level:  Active  Participation Quality:  Attentive  Affect:  Blunted  Cognitive:  Appropriate and Oriented  Insight:  Developing/Improving  Engagement in Therapy:  Engaged  Modes of Intervention:  Education,  Support and [employer], LCSW 01/27/2013, 4:16 PM

## 2013-01-27 NOTE — Progress Notes (Signed)
[facility] MD Progress Note  01/27/2013 2:23 PM [name]  MRN:  [id number] Subjective:  [name] is up and participating in groups and unit programming. He is asking about his medication. He is informed that per his request his medication management will be simplified to as few medications as possible that will effectively treat his symptoms. His celexa will be reduced to 10mg . He is flat affected today, but able to participate in conversations. He is pleased to know that Dr. Carmelina Foster will be his MD stating that they "go way back together. Dr. Shela Foster. Will remember me. 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 and Cluster B Traits  AXIS III:  Past Medical History   Diagnosis  Date   .  Hepatitis C    .  Depression    .  Anxiety     AXIS IV: housing problems, occupational problems, problems related to legal system/crime, problems with access to health care services and problems with primary support group  AXIS V: 51-60 moderate symptoms ADL's:  Impaired  Sleep: Fair  Appetite:  Good  Suicidal Ideation:  Yes but can contract for safety Homicidal Ideation:  denies AEB (as evidenced by):  Psychiatric Specialty Exam: Review of Systems  Constitutional: Negative.  Negative for fever, chills, weight loss, malaise/fatigue and diaphoresis.  HENT: Negative for congestion and sore throat.   Eyes: Negative for blurred vision, double vision and photophobia.  Respiratory: Negative for cough, shortness of breath and wheezing.   Cardiovascular: Negative for chest pain, palpitations  and PND.  Gastrointestinal: Negative for heartburn, nausea, vomiting, abdominal pain, diarrhea and constipation.  Musculoskeletal: Negative for falls, joint pain and myalgias.  Neurological: Negative for dizziness, tingling, tremors, sensory change, speech change, focal weakness, seizures, loss of consciousness, weakness and headaches.  Endo/Heme/Allergies: Negative for polydipsia. Does not bruise/bleed easily.  Psychiatric/Behavioral: Negative for depression, suicidal ideas, hallucinations, memory loss and substance abuse. The patient is not nervous/anxious and does not have insomnia.     Blood pressure 146/80, pulse 105, temperature 97.6 F (36.4 C), temperature source Oral, resp. rate 18, height 5\' 11"  (1.803 m), weight 65.318 kg (144 lb).Body mass index is 20.09 kg/(m^2).  General Appearance: Casual  Eye Contact::  Fair  Speech:  Clear and Coherent  Volume:  Decreased  Mood:  Depressed  Affect:  Flat  Thought Process:  Goal Directed  Orientation:  Full (Time, Place, and Person)  Thought Content:  WDL  Suicidal Thoughts:  Yes.  without intent/plan  Homicidal Thoughts:  No  Memory:  NA  Judgement:  Intact  Insight:  Shallow  Psychomotor Activity:  Decreased  Concentration:  Fair  Recall:  Fair  Akathisia:  No  Handed:  Right  AIMS (if indicated):     Assets:  Desire for Improvement  Sleep:  Number of Hours: 6.5   Current Medications: Current Facility-Administered Medications  Medication Dose Route Frequency Provider Last Rate Last Dose  . acetaminophen (TYLENOL) tablet 650 mg  650 mg Oral Q6H PRN Court Joy, PA-C      . alum & mag hydroxide-simeth (MAALOX/MYLANTA) 200-200-20 MG/5ML suspension 30 mL  30 mL Oral Q4H PRN Court Joy, PA-C      .  busPIRone (BUSPAR) tablet 10 mg  10 mg Oral TID Court Joy, PA-C   10 mg at 01/27/13 1300  . citalopram (CELEXA) tablet 10 mg  10 mg Oral Daily Court Joy, PA-C   10 mg at 01/27/13 1610  . gabapentin (NEURONTIN)  capsule 300 mg  300 mg Oral TID Court Joy, PA-C   300 mg at 01/27/13 1300  . ibuprofen (ADVIL,MOTRIN) tablet 600 mg  600 mg Oral Q8H PRN Earney Navy, NP      . LORazepam (ATIVAN) tablet 1 mg  1 mg Oral Q8H PRN Earney Navy, NP      . magnesium hydroxide (MILK OF MAGNESIA) suspension 30 mL  30 mL Oral Daily PRN Court Joy, PA-C      . mirtazapine (REMERON) tablet 30 mg  30 mg Oral QHS Court Joy, PA-C   30 mg at 01/26/13 2137  . nicotine (NICODERM CQ - dosed in mg/24 hours) patch 21 mg  21 mg Transdermal Daily Earney Navy, NP   21 mg at 01/27/13 9604  . ondansetron (ZOFRAN) tablet 4 mg  4 mg Oral Q8H PRN Earney Navy, NP      . propranolol (INDERAL) tablet 10 mg  10 mg Oral BID Court Joy, PA-C   10 mg at 01/27/13 5409  . QUEtiapine (SEROQUEL) tablet 50 mg  50 mg Oral TID Court Joy, PA-C   50 mg at 01/27/13 8119  . traZODone (DESYREL) tablet 100 mg  100 mg Oral QHS Court Joy, PA-C   100 mg at 01/26/13 2137  . traZODone (DESYREL) tablet 50 mg  50 mg Oral QHS PRN,MR X 1 Court Joy, PA-C        Lab Results:  Results for orders placed during the hospital encounter of 01/25/13 (from the past 48 hour(s))  ACETAMINOPHEN LEVEL     Status: None   Collection Time    01/25/13  5:36 PM      Result Value Range   Acetaminophen (Tylenol), Serum <15.0  10 - 30 ug/mL   Comment:            THERAPEUTIC CONCENTRATIONS VARY     SIGNIFICANTLY. A RANGE OF 10-30     ug/mL MAY BE AN EFFECTIVE     CONCENTRATION FOR MANY PATIENTS.     HOWEVER, SOME ARE BEST TREATED     AT CONCENTRATIONS OUTSIDE THIS     RANGE.     ACETAMINOPHEN CONCENTRATIONS     >150 ug/mL AT 4 HOURS AFTER     INGESTION AND >50 ug/mL AT 12     HOURS AFTER INGESTION ARE     OFTEN ASSOCIATED WITH TOXIC     REACTIONS.  CBC     Status: None   Collection Time    01/25/13  5:36 PM      Result Value Range   WBC 6.5  4.0 - 10.5 K/uL   RBC 4.38  4.22 - 5.81 MIL/uL   Hemoglobin 13.6   13.0 - 17.0 g/dL   HCT 14.7  82.9 - 56.2 %   MCV 90.2  78.0 - 100.0 fL   MCH 31.1  26.0 - 34.0 pg   MCHC 34.4  30.0 - 36.0 g/dL   RDW 13.0  86.5 - 78.4 %   Platelets 250  150 - 400 K/uL  COMPREHENSIVE METABOLIC PANEL     Status: Abnormal   Collection Time    01/25/13  5:36  PM      Result Value Range   Sodium 142  135 - 145 mEq/L   Potassium 4.4  3.5 - 5.1 mEq/L   Chloride 105  96 - 112 mEq/L   CO2 27  19 - 32 mEq/L   Glucose, Bld 89  70 - 99 mg/dL   BUN 9  6 - 23 mg/dL   Creatinine, Ser 1.61  0.50 - 1.35 mg/dL   Calcium 9.2  8.4 - 09.6 mg/dL   Total Protein 7.2  6.0 - 8.3 g/dL   Albumin 4.0  3.5 - 5.2 g/dL   AST 045 (*) 0 - 37 U/L   ALT 236 (*) 0 - 53 U/L   Alkaline Phosphatase 65  39 - 117 U/L   Total Bilirubin 0.7  0.3 - 1.2 mg/dL   GFR calc non Af Amer >90  >90 mL/min   GFR calc Af Amer >90  >90 mL/min   Comment: (NOTE)     The eGFR has been calculated using the CKD EPI equation.     This calculation has not been validated in all clinical situations.     eGFR's persistently <90 mL/min signify possible Chronic Kidney     Disease.  ETHANOL     Status: None   Collection Time    01/25/13  5:36 PM      Result Value Range   Alcohol, Ethyl (B) <11  0 - 11 mg/dL   Comment:            LOWEST DETECTABLE LIMIT FOR     SERUM ALCOHOL IS 11 mg/dL     FOR MEDICAL PURPOSES ONLY  SALICYLATE LEVEL     Status: Abnormal   Collection Time    01/25/13  5:36 PM      Result Value Range   Salicylate Lvl <2.0 (*) 2.8 - 20.0 mg/dL  URINE RAPID DRUG SCREEN (HOSP PERFORMED)     Status: None   Collection Time    01/25/13  6:01 PM      Result Value Range   Opiates NONE DETECTED  NONE DETECTED   Cocaine NONE DETECTED  NONE DETECTED   Benzodiazepines NONE DETECTED  NONE DETECTED   Amphetamines NONE DETECTED  NONE DETECTED   Tetrahydrocannabinol NONE DETECTED  NONE DETECTED   Barbiturates NONE DETECTED  NONE DETECTED   Comment:            DRUG SCREEN FOR MEDICAL PURPOSES     ONLY.  IF  CONFIRMATION IS NEEDED     FOR ANY PURPOSE, NOTIFY LAB     WITHIN 5 DAYS.                LOWEST DETECTABLE LIMITS     FOR URINE DRUG SCREEN     Drug Class       Cutoff (ng/mL)     Amphetamine      1000     Barbiturate      200     Benzodiazepine   200     Tricyclics       300     Opiates          300     Cocaine          300     THC              50    Physical Findings: AIMS: Facial and Oral Movements Muscles of Facial Expression: None, normal Lips and Perioral Area: None, normal  Jaw: None, normal Tongue: None, normal,Extremity Movements Upper (arms, wrists, hands, fingers): None, normal Lower (legs, knees, ankles, toes): None, normal, Trunk Movements Neck, shoulders, hips: None, normal, Overall Severity Severity of abnormal movements (highest score from questions above): None, normal Incapacitation due to abnormal movements: None, normal Patient's awareness of abnormal movements (rate only patient's report): No Awareness, Dental Status Current problems with teeth and/or dentures?: No Does patient usually wear dentures?: No  CIWA:    COWS:     Treatment Plan Summary: Daily contact with patient to assess and evaluate symptoms and progress in treatment Medication management  Plan: 1. Seroquel 50mg  po TID for anxiety and agitation. 100mg  po at hs. 2. D/C trazodone and prazosin. 3. Celexa decreased to 10mg  po qd. 4. Buspar 10mg  po TId 5. Decrease Remeron to 15mg  at hs for sleep. 6. Continue to monitor. 7. ELOS" 3-4 days.     Medical Decision Making Problem Points:  Established problem, stable/improving (1) Data Points:  Review of medication regiment & side effects (2) Review of new medications or change in dosage (2)  I certify that inpatient services furnished can reasonably be expected to improve the patient's condition.   MASHBURN,NEIL 01/27/2013, 2:23 PM Agree with assessment and plan Madie Reno A. Dub Mikes, M.D.

## 2013-01-27 NOTE — Progress Notes (Signed)
Adult Psychoeducational Group Note  Date:  01/27/2013 Time:  9:13 PM  Group Topic/Focus:  Wrap-Up Group:   The focus of this group is to help patients review their daily goal of treatment and discuss progress on daily workbooks.  Participation Level:  Active  Participation Quality:  Appropriate  Affect:  Anxious  Cognitive:  Alert  Insight: Appropriate  Engagement in Group:  Monopolizing  Modes of Intervention:  Support  Additional Comments:  Pt stated his day started out okay until another pt on the [address] called him a faggit . He stated he has been dealing with his sexuality for years and this made him feel sick on his stomach. Pt  Stated he did not want this pt to apologize to him and has no desire to ever see this pt again.   Rodman Key [facility] 01/27/2013, 9:13 PM

## 2013-01-28 MED ORDER — GABAPENTIN 400 MG PO CAPS
400.0000 mg | ORAL_CAPSULE | Freq: Three times a day (TID) | ORAL | Status: DC
  Administered 2013-01-28 – 2013-01-30 (×6): 400 mg via ORAL
  Filled 2013-01-28 (×6): qty 1
  Filled 2013-01-28 (×2): qty 42
  Filled 2013-01-28: qty 1
  Filled 2013-01-28: qty 42
  Filled 2013-01-28: qty 1

## 2013-01-28 MED ORDER — CITALOPRAM HYDROBROMIDE 20 MG PO TABS
20.0000 mg | ORAL_TABLET | Freq: Every day | ORAL | Status: DC
  Administered 2013-01-29 – 2013-01-30 (×2): 20 mg via ORAL
  Filled 2013-01-28 (×4): qty 1

## 2013-01-28 MED ORDER — QUETIAPINE FUMARATE 200 MG PO TABS
200.0000 mg | ORAL_TABLET | Freq: Every day | ORAL | Status: DC
  Administered 2013-01-28: 200 mg via ORAL
  Filled 2013-01-28 (×3): qty 1

## 2013-01-28 MED ORDER — BUSPIRONE HCL 15 MG PO TABS
15.0000 mg | ORAL_TABLET | Freq: Two times a day (BID) | ORAL | Status: DC
  Administered 2013-01-29 – 2013-01-30 (×3): 15 mg via ORAL
  Filled 2013-01-28 (×2): qty 1
  Filled 2013-01-28: qty 28
  Filled 2013-01-28 (×2): qty 1
  Filled 2013-01-28: qty 28
  Filled 2013-01-28: qty 1

## 2013-01-28 NOTE — Progress Notes (Signed)
Adult Psychoeducational Group Note  Date:  01/28/2013 Time:  9:03 PM  Group Topic/Focus:  Wrap-Up Group:   The focus of this group is to help patients review their daily goal of treatment and discuss progress on daily workbooks.  Participation Level:  Active  Participation Quality:  Appropriate  Affect:  Appropriate  Cognitive:  Appropriate  Insight: Appropriate  Engagement in Group:  Engaged  Modes of Intervention:  Support  Additional Comments:  Pt stated that he was able to master his breathing technique., deep breathing, and that he was able to get his anxiety under control by dong it. He says he had an anxiety attack while in group so he stepped out, went to his room and was able to get it under control without medication. Pt was given support and encouragement   Biana Haggar 01/28/2013, 9:03 PM

## 2013-01-28 NOTE — Progress Notes (Signed)
[facility] MD Progress Note  01/28/2013 1:50 PM [name]  MRN:  [id number]  Subjective:  Hue is known to this provider from his teenage years from out patient programs in [place]. Paco admitted from [facility] after 1 1/2 weeks of admission for increased symptoms of depression and anxiety, he was previously admitted about two weeks ago to [address]. Patient stated that he is overwhelmed about too many people over there, unable to sociolizes, stayed in his room and than cut himself with razor blades superficially. He is contracting for safety and stated that he has been masturbating and concern about being gay and not able to talk to others. Nathyn is participating in groups and unit programming.  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 and Cluster B Traits  AXIS III:  Past Medical History   Diagnosis  Date   .  Hepatitis C    .  Depression    .  Anxiety     AXIS IV: housing problems, occupational problems, problems related to legal system/crime, problems with access to health care services and problems with primary support group  AXIS V: 51-60 moderate symptoms ADL's:  Impaired  Sleep: Fair  Appetite:  Good  Suicidal Ideation:  Yes but can contract for safety Homicidal Ideation:  denies AEB (as evidenced by):  Psychiatric Specialty Exam: Review of Systems  Constitutional: Negative.  Negative for fever, chills, weight loss, malaise/fatigue and diaphoresis.  HENT: Negative for congestion and sore throat.   Eyes: Negative for blurred vision, double vision and  photophobia.  Respiratory: Negative for cough, shortness of breath and wheezing.   Cardiovascular: Negative for chest pain, palpitations and PND.  Gastrointestinal: Negative for heartburn, nausea, vomiting, abdominal pain, diarrhea and constipation.  Musculoskeletal: Negative for falls, joint pain and myalgias.  Neurological: Negative for dizziness, tingling, tremors, sensory change, speech change, focal weakness, seizures, loss of consciousness, weakness and headaches.  Endo/Heme/Allergies: Negative for polydipsia. Does not bruise/bleed easily.  Psychiatric/Behavioral: Negative for depression, suicidal ideas, hallucinations, memory loss and substance abuse. The patient is not nervous/anxious and does not have insomnia.     Blood pressure 104/67, pulse 89, temperature 97.6 F (36.4 C), temperature source Oral, resp. rate 18, height 5\' 11"  (1.803 m), weight 65.318 kg (144 lb).Body mass index is 20.09 kg/(m^2).  General Appearance: Casual  Eye Contact::  Fair  Speech:  Clear and Coherent  Volume:  Decreased  Mood:  Depressed  Affect:  Flat  Thought Process:  Goal Directed  Orientation:  Full (Time, Place, and Person)  Thought Content:  WDL  Suicidal Thoughts:  Yes.  without intent/plan  Homicidal Thoughts:  No  Memory:  NA  Judgement:  Intact  Insight:  Shallow  Psychomotor Activity:  Decreased  Concentration:  Fair  Recall:  Fair  Akathisia:  No  Handed:  Right  AIMS (if indicated):     Assets:  Desire for Improvement  Sleep:  Number of Hours: 6.5   Current Medications: Current Facility-Administered Medications  Medication Dose Route Frequency Provider Last Rate Last Dose  . acetaminophen (TYLENOL) tablet 650 mg  650 mg Oral Q6H PRN Court Joy, PA-C      . alum & Jodelle Green  hydroxide-simeth (MAALOX/MYLANTA) 200-200-20 MG/5ML suspension 30 mL  30 mL Oral Q4H PRN Court Joy, PA-C      . Melene Muller ON 01/29/2013] busPIRone (BUSPAR) tablet 15 mg  15 mg Oral BID Nehemiah Settle, MD      . Melene Muller ON 01/29/2013] citalopram (CELEXA) tablet 20 mg  20 mg Oral Daily Nehemiah Settle, MD      . gabapentin (NEURONTIN) capsule 400 mg  400 mg Oral TID Nehemiah Settle, MD      . ibuprofen (ADVIL,MOTRIN) tablet 600 mg  600 mg Oral Q8H PRN Earney Navy, NP      . magnesium hydroxide (MILK OF MAGNESIA) suspension 30 mL  30 mL Oral Daily PRN Court Joy, PA-C      . nicotine (NICODERM CQ - dosed in mg/24 hours) patch 21 mg  21 mg Transdermal Daily Earney Navy, NP   21 mg at 01/28/13 0745  . ondansetron (ZOFRAN) tablet 4 mg  4 mg Oral Q8H PRN Earney Navy, NP      . QUEtiapine (SEROQUEL) tablet 200 mg  200 mg Oral QHS Nehemiah Settle, MD        Lab Results:  No results found for this or any previous visit (from the past 48 hour(s)).  Physical Findings: AIMS: Facial and Oral Movements Muscles of Facial Expression: None, normal Lips and Perioral Area: None, normal Jaw: None, normal Tongue: None, normal,Extremity Movements Upper (arms, wrists, hands, fingers): None, normal Lower (legs, knees, ankles, toes): None, normal, Trunk Movements Neck, shoulders, hips: None, normal, Overall Severity Severity of abnormal movements (highest score from questions above): None, normal Incapacitation due to abnormal movements: None, normal Patient's awareness of abnormal movements (rate only patient's report): No Awareness, Dental Status Current problems with teeth and/or dentures?: No Does patient usually wear dentures?: No  CIWA:    COWS:     Treatment Plan Summary: Daily contact with patient to assess and evaluate symptoms and progress in treatment Medication management  Plan: 1. Change Seroquel 200 mg po at hs. 2. Increase Celexa 20mg  po qd. 3. Change Buspar 15 mg po BID 4. Increase Neurontin 400 mg TID 5. Discontinue Remeron and Inderal - not helpful 6. Continue to monitor. 7. ELOS" 3-4 days.   Medical Decision  Making Problem Points:  Established problem, stable/improving (1) Data Points:  Review of medication regiment & side effects (2) Review of new medications or change in dosage (2)  I certify that inpatient services furnished can reasonably be expected to improve the patient's condition.   Douglas Rooks,[name] 01/28/2013, 1:50 PM

## 2013-01-28 NOTE — Progress Notes (Signed)
Patient ID: [name], male   DOB: 02-11-1991, 22 y.o.   MRN: [id number] D)  Attended group this evening, voiced his frustration with a peer on [address] who had said some inappropriate things to him earlier today,  States is still feeling anxious and upset over the incident,  but doesn't want to give him that kind of control.  Stated the incident has left him feeling anxious, stated would fight if he had to.  Others in the group were able to offer suggestions that would help pt feel safe tomorrow and stay separated from peer, were supportive.  Has been pleasant, interacting appropriately with peers on his hall, compliant with meds. A)  Will continue to monitor for safety, was encouraged to keep a safe distance from peer, to notify staff if peer should say something, rather than addressing it himself.  Continue POC R)  safety maintained.

## 2013-01-28 NOTE — Progress Notes (Signed)
Adult Psychoeducational Group Note  Date:  01/28/2013 Time:  11:00am Group Topic/Focus:  Self Care:   The focus of this group is to help patients understand the importance of self-care in order to improve or restore emotional, physical, spiritual, interpersonal, and financial health.  Participation Level:  Did Not Attend  Participation Quality:    Affect:   Cognitive:    Insight:   Engagement in Group:    Modes of Intervention:    Additional Comments:  Pt did not attend.  Shelly Bombard D 01/28/2013, 1:32 PM

## 2013-01-28 NOTE — Progress Notes (Signed)
Report received from Loney Loh RN. Writer entered patients room and observed him lying in bed asleep with eyes closed and respirations even and unlabored, no distress noted. Safety maintained on unit with 15 min checks, will continue to monitor.

## 2013-01-28 NOTE — BHH Counselor (Signed)
Adult Psychosocial Assessment Update Interdisciplinary Team  Previous [facility] Hospital admissions/discharges:  Admissions Discharges  Date:  01/04/13 Date: 01/11/13  Date: Date:  Date: Date:  Date: Date:  Date: Date:   Changes since the last Psychosocial Assessment (including adherence to outpatient mental health and/or substance abuse treatment, situational issues contributing to decompensation and/or relapse). Patient admitted to hospital due to self harming behavior.  He reports increased depression and anxiety.  He was at [facility] prior to admission for SA treatment and plans to return there at discharge.             Discharge Plan 1. Will you be returning to the same living situation after discharge?   Yes: No:      If no, what is your plan?    Patient will return to [facility] for continued treatment       2. Would you like a referral for services when you are discharged? Yes:     If yes, for what services?  No:              Summary and Recommendations (to be completed by the evaluator) [name] is a 22 years old Caucasian male admitted with Major Depression Disorder and Alcohol Dependence.  He will benefit from crisis stabilization, evaluation for medication, psycho-education groups for coping skills development, group therapy and case management for discharge planning.                        Signature:  Wynn Banker, 01/28/2013 11:41 AM

## 2013-01-28 NOTE — Progress Notes (Addendum)
D: Patient denies SI/HI and auditory and visual hallucinations. The patient has a depressed and anxious mood and affect. The patient states that he is here because he was found cutting himself and [facility]. The patient states that he would like to work on healthy coping skills such as exercise while he is here. The patient states that he could like to have "cognitive behavioral therapy" when he leaves this facility. The patient is attending groups on the unit and is interacting appropriately within the milieu.  A: Patient given emotional support from RN. Patient encouraged to come to staff with concerns and/or questions. Patient's medication routine continued. Patient's orders and plan of care reviewed.  R: Patient remains appropriate and cooperative. Will continue to monitor patient q15 minutes for safety.

## 2013-01-28 NOTE — Tx Team (Signed)
Interdisciplinary Treatment Plan Update   Date Reviewed:  01/28/2013  Time Reviewed:  9:43 AM  Progress in Treatment:   Attending groups: Yes Participating in groups: Yes Taking medication as prescribed: Yes  Tolerating medication: Yes Family/Significant other contact made: No, but will ask patient for consent for collateral contact Patient understands diagnosis: Yes  Discussing patient identified problems/goals with staff: Yes Medical problems stabilized or resolved: Yes Denies suicidal/homicidal ideation: Yes Patient has not harmed self or others: Yes  For review of initial/current patient goals, please see plan of care.  Estimated Length of Stay:  2-3 days  Reasons for Continued Hospitalization:  Anxiety Depression Medication stabilization Suicidal ideation  New Problems/Goals identified:    Discharge Plan or Barriers:   Home with outpatient follow up  Additional Comments:  [name] is a 22 year old WM who presented to [facility] from [facility] treatment program where he has been since his discharge from [facility] on 01/04/2013. [name] who has a history of depression, PTSD and GAD with a history of alcohol dependence in early remission, reports that he is feeling suicidal and has resumed cutting. He states his medications have been changed by [facility] since his admission there and he hasn't felt well for several weeks now. His anxiety has increased significantly, and he has become suicidal with a plan to drink bleach, but was stopped.   Attendees:  Patient:  01/28/2013 9:43 AM   Signature: Mervyn Gay, MD 01/28/2013 9:43 AM  Signature:   01/28/2013 9:43 AM  Signature:  01/28/2013 9:43 AM  Signature:Beverly Terrilee Croak, RN 01/28/2013 9:43 AM  Signature:  Neill Loft RN 01/28/2013 9:43 AM  Signature:  Juline Patch, LCSW 01/28/2013 9:43 AM  Signature:  [name], LCSW 01/28/2013 9:43 AM  Signature:  Maseta Dorley,Care Coordinator 01/28/2013 9:43 AM  Signature:    01/28/2013 9:43 AM  Signature:  01/28/2013  9:43 AM  Signature:    01/28/2013  9:43 AM  Signature:  01/28/2013  9:43 AM    Scribe for Treatment Team:   Juline Patch,  01/28/2013 9:43 AM

## 2013-01-29 MED ORDER — QUETIAPINE FUMARATE 200 MG PO TABS
200.0000 mg | ORAL_TABLET | Freq: Every day | ORAL | Status: DC
  Administered 2013-01-29: 200 mg via ORAL
  Filled 2013-01-29: qty 1
  Filled 2013-01-29: qty 14
  Filled 2013-01-29: qty 1

## 2013-01-29 NOTE — Progress Notes (Signed)
[facility] MD Progress Note  01/29/2013 1:59 PM [name]  MRN:  [id number]  Subjective:  Complained that he slept only four hours last night due to ruminating thoughts about going back to [facility]. He feels [facility] where [facility] is few people but currently no beds available. Alexus was overwhelmed about too many people at [facility] recovery center, unable to sociolizes, stayed in his room and than cut himself with razor blades superficially. He is contracting for safety and concern about being gay and not able to talk to others. Zeph is participating in groups and unit programming. He has less depression and anxiety is rated as 7/10. He denied suicidal thoughts. He will be meeting case manager to discuss for the possible program other than [facility] for substance treatment.  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 and Cluster B Traits  AXIS III:  Past Medical History   Diagnosis  Date   .  Hepatitis C    .  Depression    .  Anxiety     AXIS IV: housing problems, occupational problems, problems related to legal system/crime, problems with access to health care services and problems with primary support group  AXIS V: 51-60 moderate symptoms ADL's:  Impaired  Sleep: Fair  Appetite:  Good  Suicidal Ideation:  Yes but can contract for safety Homicidal Ideation:  denies AEB (as evidenced by):  Psychiatric Specialty Exam: Review of Systems  Constitutional: Negative.  Negative for fever, chills, weight loss, malaise/fatigue and diaphoresis.  HENT: Negative for congestion  and sore throat.   Eyes: Negative for blurred vision, double vision and photophobia.  Respiratory: Negative for cough, shortness of breath and wheezing.   Cardiovascular: Negative for chest pain, palpitations and PND.  Gastrointestinal: Negative for heartburn, nausea, vomiting, abdominal pain, diarrhea and constipation.  Musculoskeletal: Negative for falls, joint pain and myalgias.  Neurological: Negative for dizziness, tingling, tremors, sensory change, speech change, focal weakness, seizures, loss of consciousness, weakness and headaches.  Endo/Heme/Allergies: Negative for polydipsia. Does not bruise/bleed easily.  Psychiatric/Behavioral: Negative for depression, suicidal ideas, hallucinations, memory loss and substance abuse. The patient is not nervous/anxious and does not have insomnia.     Blood pressure 124/68, pulse 108, temperature 97.7 F (36.5 C), temperature source Oral, resp. rate 16, height 5\' 11"  (1.803 m), weight 65.318 kg (144 lb).Body mass index is 20.09 kg/(m^2).  General Appearance: Casual  Eye Contact::  Fair  Speech:  Clear and Coherent  Volume:  Decreased  Mood:  Depressed  Affect:  Flat  Thought Process:  Goal Directed  Orientation:  Full (Time, Place, and Person)  Thought Content:  WDL  Suicidal Thoughts:  Yes.  without intent/plan  Homicidal Thoughts:  No  Memory:  NA  Judgement:  Intact  Insight:  Shallow  Psychomotor Activity:  Decreased  Concentration:  Fair  Recall:  Fair  Akathisia:  No  Handed:  Right  AIMS (if indicated):     Assets:  Desire for Improvement  Sleep:  Number of Hours: 6   Current Medications: Current Facility-Administered Medications  Medication Dose Route Frequency Provider Last Rate Last Dose  . acetaminophen (TYLENOL) tablet 650 mg  650 mg Oral  Q6H PRN Court Joy, PA-C      . alum & mag hydroxide-simeth (MAALOX/MYLANTA) 200-200-20 MG/5ML suspension 30 mL  30 mL Oral Q4H PRN Court Joy, PA-C      . busPIRone (BUSPAR)  tablet 15 mg  15 mg Oral BID Nehemiah Settle, MD   15 mg at 01/29/13 0757  . citalopram (CELEXA) tablet 20 mg  20 mg Oral Daily Nehemiah Settle, MD   20 mg at 01/29/13 0757  . gabapentin (NEURONTIN) capsule 400 mg  400 mg Oral TID Nehemiah Settle, MD   400 mg at 01/29/13 1138  . ibuprofen (ADVIL,MOTRIN) tablet 600 mg  600 mg Oral Q8H PRN Earney Navy, NP      . magnesium hydroxide (MILK OF MAGNESIA) suspension 30 mL  30 mL Oral Daily PRN Court Joy, PA-C      . nicotine (NICODERM CQ - dosed in mg/24 hours) patch 21 mg  21 mg Transdermal Daily Earney Navy, NP   21 mg at 01/29/13 0755  . ondansetron (ZOFRAN) tablet 4 mg  4 mg Oral Q8H PRN Earney Navy, NP      . QUEtiapine (SEROQUEL) tablet 200 mg  200 mg Oral QHS Nehemiah Settle, MD        Lab Results:  No results found for this or any previous visit (from the past 48 hour(s)).  Physical Findings: AIMS: Facial and Oral Movements Muscles of Facial Expression: None, normal Lips and Perioral Area: None, normal Jaw: None, normal Tongue: None, normal,Extremity Movements Upper (arms, wrists, hands, fingers): None, normal Lower (legs, knees, ankles, toes): None, normal, Trunk Movements Neck, shoulders, hips: None, normal, Overall Severity Severity of abnormal movements (highest score from questions above): None, normal Incapacitation due to abnormal movements: None, normal Patient's awareness of abnormal movements (rate only patient's report): No Awareness, Dental Status Current problems with teeth and/or dentures?: No Does patient usually wear dentures?: No  CIWA:    COWS:     Treatment Plan Summary: Daily contact with patient to assess and evaluate symptoms and progress in treatment Medication management  Plan: 1. Change Seroquel 200 mg po at 8.00 PM 2. Continue Celexa 20mg  po qd. 3. Continue Buspar 15 mg po BID 4. Continue Neurontin 400 mg TID 5. Continue to  monitor. 6. ELOS" 3-4 days. 7. Disposition plans in progress.    Medical Decision Making Problem Points:  Established problem, stable/improving (1) Data Points:  Review of medication regiment & side effects (2) Review of new medications or change in dosage (2)  I certify that inpatient services furnished can reasonably be expected to improve the patient's condition.   Tanyla Stege,[name] 01/29/2013, 1:59 PM

## 2013-01-29 NOTE — Progress Notes (Signed)
Patient ID: [name], male   DOB: 04/26/1990, 22 y.o.   MRN: [id number] D-Patient reports his sleep was fair and his appetite is good.  His energy level is low and his ability yo pay attention is improving.  He rates his depression at 3/10 and his hopelessness at 5/10.  He denies thoughts of self harm. Patient says he is proud of being clean and he wants to concentrate on his recovery.  A- Praised patient for his sobriety and asked him to think about his long term goals and how he is going to begin to work toward them.  R- Patient says he wants to be independent and self sufficient. He he plans to first go back to [facility].  Then he wants to get a job and then start taking classes.  His interest is journalism and writing.  Encouraged him to start journaling while here.  Patient says he plans to.

## 2013-01-29 NOTE — Discharge Summary (Signed)
Reviewed

## 2013-01-29 NOTE — [facility] Group Notes (Signed)
[facility] LCSW Group Therapy      Feelings About Diagnosis 1:15 - 2:30 PM         01/29/2013  3:01 PM    Type of Therapy:  Group Therapy  Participation Level:  Active  Participation Quality:  Appropriate  Affect:  Appropriate  Cognitive:  Alert and Appropriate  Insight:  Developing/Improving and Engaged  Engagement in Therapy:  Developing/Improving and Engaged  Modes of Intervention:  Discussion, Education, Exploration, Problem-Solving, Rapport Building, Support  Summary of Progress/Problems:  Patient actively participated in group. Patient discussed past and present diagnosis and the effects it has had on  life.  Patient talked about family and society being judgmental and the stigma associated with having a mental health diagnosis.  Patient shared he is relieved to know he is not crazy and that there are medications that can help him.    Wynn Banker 01/29/2013 .3:01 PM

## 2013-01-29 NOTE — Progress Notes (Signed)
Recreation Therapy Notes  Date: 10.21.2014 Time: 2:30pm Location: [address] Dayroom  Group Topic: Animal Assisted Activities (AAA)  Behavioral Response: Appropriate, Engaged  Affect: Euthymic  Clinical Observations/Feedback: Dog Team: [place] & handler. Patient interacted appropriately with peer, dog team, LRT and MHT.   Abaigeal Moomaw L Jaydin Jalomo, LRT/CTRS  Eliaz Fout L 01/29/2013 4:33 PM 

## 2013-01-29 NOTE — Progress Notes (Addendum)
Patient ID: [name], male   DOB: 02-09-1991, 22 y.o.   MRN: [id number]  D: Pt denies SI/HI/AVH. Pt is pleasant and cooperative. Had extensive talk with pt. And pt appeared engaged. Pt repeated a lot of the things stated in earlier notes, but seemed more relaxed after talking. Pt talked about someone calling him a name, not wanting to go back to [facility], but that may only be the place he can go.  A: Pt was offered support and encouragement. Pt was given scheduled medications. Pt was encourage to attend groups. Q 15 minute checks were done for safety.   R:Pt attends groups and interacts well with peers and staff. Pt is taking medication. Pt has no complaints at this time.Pt receptive to treatment and safety maintained on unit.

## 2013-01-29 NOTE — Progress Notes (Signed)
Adult Psychoeducational Group Note  Date:  01/29/2013 Time:  11:00am Group Topic/Focus:  Recovery Goals:   The focus of this group is to identify appropriate goals for recovery and establish a plan to achieve them.  Participation Level:  Did Not Attend  Participation Quality:    Affect:    Cognitive:    Insight:   Engagement in Group:    Modes of Intervention:    Additional Comments:  Pt did not attend group  Shelly Bombard D 01/29/2013, 5:12 PM

## 2013-01-29 NOTE — Progress Notes (Signed)
The focus of this group is to educate the patient on the purpose and policies of crisis stabilization and provide a format to answer questions about their admission.  The group details unit policies and expectations of patients while admitted.  Patient attended part of the 0900 nurse education orientation group this morning.  Patient was with staff during some of the group time.

## 2013-01-30 DIAGNOSIS — F1910 Other psychoactive substance abuse, uncomplicated: Secondary | ICD-10-CM

## 2013-01-30 MED ORDER — BUSPIRONE HCL 15 MG PO TABS: 15.0000 mg | ORAL_TABLET | Freq: Two times a day (BID) | ORAL | Status: DC

## 2013-01-30 MED ORDER — GABAPENTIN 400 MG PO CAPS: 400.0000 mg | ORAL_CAPSULE | Freq: Three times a day (TID) | ORAL | Status: DC

## 2013-01-30 MED ORDER — CITALOPRAM HYDROBROMIDE 20 MG PO TABS: 20.0000 mg | ORAL_TABLET | Freq: Every day | ORAL | Status: DC

## 2013-01-30 MED ORDER — QUETIAPINE FUMARATE 200 MG PO TABS: 200.0000 mg | ORAL_TABLET | Freq: Every day | ORAL | Status: DC

## 2013-01-30 NOTE — [facility] Suicide Risk Assessment (Signed)
[facility] INPATIENT:  Family/Significant Other Suicide Prevention Education  Suicide Prevention Education:  Patient Discharged to Other Healthcare Facility:  Suicide Prevention Education Not Provided: {PT. DISCHARGED TO OTHER HEALTHCARE FACILITY:SUICIDE PREVENTION EDUCATION NOT PROVIDED (CHL):  The patient is discharging to another healthcare facility for continuation of treatment.  The patient's medical information, including suicide ideations and risk factors, are a part of the medical information shared with the receiving healthcare facility.  Patient to return to [facility].  Wynn Banker 01/30/2013, 11:34 AM

## 2013-01-30 NOTE — [facility] Group Notes (Signed)
[facility] LCSW Aftercare Discharge Planning Group Note   01/30/2013 11:35 AM    Participation Quality:  Appropraite  Mood/Affect:  Appropriate  Depression Rating:  1  Anxiety Rating:  4  Thoughts of Suicide:  No  Will you contract for safety?   NA  Current AVH:  No  Plan for Discharge/Comments:  Patient attending discharge planning group and actively participated in group.    He reports doing well and hopes to return to [facility] today. CSW provided all participants with daily workbook.   Transportation Means: Patient has transportation.   Supports:  Patient has a support system.   Aivy Akter, Joesph July

## 2013-01-30 NOTE — Progress Notes (Signed)
Adult Psychoeducational Group Note  Date:  01/30/2013 Time:  11:00am  Group Topic/Focus:  Personal Choices and Values:   The focus of this group is to help patients assess and explore the importance of values in their lives, how their values affect their decisions, how they express their values and what opposes their expression.  Participation Level:  Did Not Attend  Participation Quality:    Affect:   Cognitive:    Insight:  Engagement in Group:    Modes of Intervention:    Additional Comments:  Pt did not attend group.  Shelly Bombard D 01/30/2013, 11:48 AM

## 2013-01-30 NOTE — [facility] Suicide Risk Assessment (Signed)
Suicide Risk Assessment  Discharge Assessment     Demographic Factors:  Male, Adolescent or young adult, Caucasian, Gay, lesbian, or bisexual orientation, Low socioeconomic status, Living alone and Unemployed  Mental Status Per Nursing Assessment::   On Admission:     Current Mental Status by Physician: [name] is calm, quite and cooperative. He has good mood and appropriate affect. He has a normal speech and thought process. Patient has no suicidal or homicidal ideation intention or plans. Patient denied self-injurious behaviors. Patient has no evidence of psychosis.  Loss Factors: Decrease in vocational status and Financial problems/change in socioeconomic status  Historical Factors: Prior suicide attempts, Family history of mental illness or substance abuse and Impulsivity  Risk Reduction Factors:   Sense of responsibility to family, Religious beliefs about death, Positive social support and Positive coping skills or problem solving skills  Continued Clinical Symptoms:  Severe Anxiety and/or Agitation Depression:   Impulsivity Recent sense of peace/wellbeing Alcohol/Substance Abuse/Dependencies Unstable or Poor Therapeutic Relationship Previous Psychiatric Diagnoses and Treatments  Cognitive Features That Contribute To Risk:  Polarized thinking    Suicide Risk:  Minimal: No identifiable suicidal ideation.  Patients presenting with no risk factors but with morbid ruminations; may be classified as minimal risk based on the severity of the depressive symptoms  Discharge Diagnoses:   AXIS I:  Generalized Anxiety Disorder and Major Depression, Recurrent severe and polysubstance abuse AXIS II:  Deferred AXIS III:   Past Medical History  Diagnosis Date  . Hepatitis C   . Depression   . Anxiety    AXIS IV:  economic problems, housing problems, occupational problems, other psychosocial or environmental problems, problems related to social environment and problems with primary  support group AXIS V:  51-60 moderate symptoms  Plan Of Care/Follow-up recommendations:  Activity:  As tolerated Diet:  Regular  Is patient on multiple antipsychotic therapies at discharge:  No   Has Patient had three or more failed trials of antipsychotic monotherapy by history:  No  Recommended Plan for Multiple Antipsychotic Therapies: NA  [name],[name]. 01/30/2013, 12:41 PM

## 2013-01-30 NOTE — Progress Notes (Signed)
[facility] Adult Case Management Discharge Plan :  Will you be returning to the same living situation after discharge: Yes,  Patient to return to [facility]. At discharge, do you have transportation home?:Yes,  Staff of [facility] to transport patient to the facility. Do you have the ability to pay for your medications:  No, patient will be assisted with indigent medications.   Release of information consent forms completed and in the chart;  Patient's signature needed at discharge.  Patient to Follow up at: Follow-up Information   Follow up with [facility] On 01/30/2013. (Patient to return to [facility] on day of discharge.)    Contact information:   [address] [place], [place]    [address]  [phone]               Patient denies SI/HI:   Patient no longer endorsing SI/HI or other thoughts of self harm.    Safety Planning and Suicide Prevention discussed: .Reviewed with all patients during discharge planning group  Cayleb Jarnigan, Joesph July 01/30/2013, 11:53 AM

## 2013-01-30 NOTE — Progress Notes (Signed)
Adult Psychoeducational Group Note  Date:  01/30/2013 Time:  2:10 AM  Group Topic/Focus:  Wrap-Up Group:   The focus of this group is to help patients review their daily goal of treatment and discuss progress on daily workbooks.  Participation Level:  Active  Participation Quality:  Appropriate  Affect:  Appropriate  Cognitive:  Appropriate  Insight: Appropriate  Engagement in Group:  Engaged  Modes of Intervention:  Support  Additional Comments:  Patient attended and participated in group tonight. He reports having a good day. He talked to the doctor, attended his meals, and groups. For his recovery plans he will be discharged to [facility] and after his 60 day stay at [facility] he plans to get involved with [facility].  Lita Mains [facility] 01/30/2013, 2:10 AM

## 2013-01-30 NOTE — Discharge Summary (Signed)
Physician Discharge Summary Note  Patient:  [name] is an 22 y.o., male MRN:  [id number] DOB:  06/18/1990 Patient phone:  [phone] (home)  Patient address:   [address] [address]   Date of Admission:  01/26/2013 Date of Discharge: 01/30/2013  Discharge Diagnoses: Active Problems:   Major depression  Axis Diagnosis:  AXIS I: Generalized Anxiety Disorder and Major Depression, Recurrent severe and polysubstance abuse  AXIS II: Deferred  AXIS III:  Past Medical History   Diagnosis  Date   .  Hepatitis C    .  Depression    .  Anxiety     AXIS IV: economic problems, housing problems, occupational problems, other psychosocial or environmental problems, problems related to social environment and problems with primary support group  AXIS V: 51-60 moderate symptoms   Level of Care:  OP  Hospital Course:   [name] is a 22 year old WM who presented to [facility] from [facility] treatment program where he has been since his discharge from [facility] on 01/04/2013. [name] who has a history of depression, PTSD and GAD with a history of alcohol dependence in early remission, reports that he is feeling suicidal and has resumed cutting. He states his medications have been changed by [facility] since his admission there and he hasn't felt well for several weeks now. His anxiety has increased significantly, and he has become suicidal with a plan to drink bleach, but was stopped. He presented to the [facility] requesting help with his depression especially the medication management aspect of his care. He will return to [facility] upon discharge from [facility].  While a patient in this hospital, [name] was enrolled in group counseling and activities as well as received the following medication Current facility-administered medications:acetaminophen (TYLENOL) tablet 650 mg, 650 mg, Oral, Q6H PRN, Court Joy, PA-C;  alum & mag hydroxide-simeth (MAALOX/MYLANTA) 200-200-20 MG/5ML  suspension 30 mL, 30 mL, Oral, Q4H PRN, Court Joy, PA-C;  busPIRone (BUSPAR) tablet 15 mg, 15 mg, Oral, BID, Nehemiah Settle, MD, 15 mg at 01/30/13 6962 citalopram (CELEXA) tablet 20 mg, 20 mg, Oral, Daily, Nehemiah Settle, MD, 20 mg at 01/30/13 9528;  gabapentin (NEURONTIN) capsule 400 mg, 400 mg, Oral, TID, Nehemiah Settle, MD, 400 mg at 01/30/13 1133;  ibuprofen (ADVIL,MOTRIN) tablet 600 mg, 600 mg, Oral, Q8H PRN, Earney Navy, NP;  magnesium hydroxide (MILK OF MAGNESIA) suspension 30 mL, 30 mL, Oral, Daily PRN, Court Joy, PA-C nicotine (NICODERM CQ - dosed in mg/24 hours) patch 21 mg, 21 mg, Transdermal, Daily, Earney Navy, NP, 21 mg at 01/29/13 0755;  ondansetron (ZOFRAN) tablet 4 mg, 4 mg, Oral, Q8H PRN, Earney Navy, NP;  QUEtiapine (SEROQUEL) tablet 200 mg, 200 mg, Oral, QHS, Nehemiah Settle, MD, 200 mg at 01/29/13 2052 Current outpatient prescriptions:busPIRone (BUSPAR) 15 MG tablet, Take 1 tablet (15 mg total) by mouth 2 (two) times daily., Disp: 60 tablet, Rfl: 0;  citalopram (CELEXA) 20 MG tablet, Take 1 tablet (20 mg total) by mouth daily., Disp: 30 tablet, Rfl: 0;  gabapentin (NEURONTIN) 400 MG capsule, Take 1 capsule (400 mg total) by mouth 3 (three) times daily., Disp: 90 capsule, Rfl: 0 QUEtiapine (SEROQUEL) 200 MG tablet, Take 1 tablet (200 mg total) by mouth at bedtime., Disp: 30 tablet, Rfl: 0  Patient attended treatment team meeting this am and met with treatment team members. Pt symptoms, treatment plan and response to treatment discussed. [name] endorsed that their  symptoms have improved. Pt also stated that they are stable for discharge.  In other to control Active Problems:   Major depression , they will continue psychiatric care on outpatient basis. They will follow-up at      Follow-up Information   Follow up with [facility] On 01/30/2013. (Patient to return to [facility] on day of  discharge.)    Contact information:   [address] [place], [place]    [address]  [phone]             .  In addition they were instructed to take all your medications as prescribed by your mental healthcare provider, to report any adverse effects and or reactions from your medicines to your outpatient provider promptly, patient is instructed and cautioned to not engage in alcohol and or illegal drug use while on prescription medicines, in the event of worsening symptoms, patient is instructed to call the crisis hotline, 911 and or go to the nearest ED for appropriate evaluation and treatment of symptoms.   Upon discharge, patient adamantly denies suicidal, homicidal ideations, auditory, visual hallucinations and or delusional thinking. They left [facility] with all personal belongings in no apparent distress.  Consults:  See electronic record for details  Significant Diagnostic Studies:  See electronic record for details  Discharge Vitals:   Blood pressure 104/70, pulse 101, temperature 97.8 F (36.6 C), temperature source Oral, resp. rate 16, height 5\' 11"  (1.803 m), weight 65.318 kg (144 lb)..  Mental Status Exam: See Mental Status Examination and Suicide Risk Assessment completed by Attending Physician prior to discharge.  Discharge destination:  Home  Is patient on multiple antipsychotic therapies at discharge:  No  Has Patient had three or more failed trials of antipsychotic monotherapy by history: N/A Recommended Plan for Multiple Antipsychotic Therapies: N/A    Medication List    STOP taking these medications       mirtazapine 30 MG tablet  Commonly known as:  REMERON     prazosin 1 MG capsule  Commonly known as:  MINIPRESS     traZODone 100 MG tablet  Commonly known as:  DESYREL      TAKE these medications     Indication   busPIRone 15 MG tablet  Commonly known as:  BUSPAR  Take 1 tablet (15 mg total) by mouth 2 (two) times daily.   Indication:   Anxiety Disorder, Generalized Anxiety Disorder     citalopram 20 MG tablet  Commonly known as:  CELEXA  Take 1 tablet (20 mg total) by mouth daily.   Indication:  Depression     gabapentin 400 MG capsule  Commonly known as:  NEURONTIN  Take 1 capsule (400 mg total) by mouth 3 (three) times daily.   Indication:  Agitation, Anxiety     QUEtiapine 200 MG tablet  Commonly known as:  SEROQUEL  Take 1 tablet (200 mg total) by mouth at bedtime.   Indication:  Depressive Phase of Manic-Depression       Follow-up Information   Follow up with [facility] On 01/30/2013. (Patient to return to [facility] on day of discharge.)    Contact information:   [address] [place], [place]    [address]  [phone]              Follow-up recommendations:   Activities: Resume typical activities Diet: Resume typical diet Tests: none Other: Follow up with outpatient provider and report any side effects to out patient prescriber.  Comments:  Take all your medications as prescribed by your mental healthcare provider. Report any adverse effects and or reactions from your medicines to your outpatient provider promptly. Patient is instructed and cautioned to not engage in alcohol and or illegal drug use while on prescription medicines. In the event of worsening symptoms, patient is instructed to call the crisis hotline, 911 and or go to the nearest ED for appropriate evaluation and treatment of symptoms. Follow-up with your primary care provider for your other medical issues, concerns and or health care needs.  SignedFransisca Kaufmann NP-C 01/30/2013 3:54 PM  Patient seen for psychiatric evaluation, completed suicide risk assessment, case discussed with the treatment team and developed disposition plan.Reviewed the information documented and agree with the treatment plan.  Janiah Devinney,[name] 01/30/2013 4:43 PM

## 2013-01-30 NOTE — Progress Notes (Signed)
Patient ID: [name], male   DOB: [birth year], 22 y.o.   MRN: [id number] He has been discharged and was picked up by [facility] staff.He voiced understanding of discharge instruction and of the follow up plan to go to [facility]. He denies thoughts of wanting to harm self or others. All belongings taken home with him.

## 2013-01-30 NOTE — Progress Notes (Signed)
Patient ID: [name], male   DOB: 10-12-1990, 22 y.o.   MRN: [id number] He has been up and to parts of the group. Interacting with peers and staff. Self Inventory: depression 2, hopelessness 3, denies SI thoughts and no c/o discomfort. He stated that he wanted to go back to [facility] when discharged.

## 2013-01-30 NOTE — Tx Team (Signed)
Interdisciplinary Treatment Plan Update   Date Reviewed:  01/30/2013  Time Reviewed:  9:40 AM  Progress in Treatment:   Attending groups: Yes Participating in groups: Yes Taking medication as prescribed: Yes  Tolerating medication: Yes Family/Significant other contact made: No,attempted to contact father but no call back from him. Patient understands diagnosis: Yes  Discussing patient identified problems/goals with staff: Yes Medical problems stabilized or resolved: Yes Denies suicidal/homicidal ideation: Yes Patient has not harmed self or others: Yes  For review of initial/current patient goals, please see plan of care.  Estimated Length of Stay:  Discharge today  Reasons for Continued Hospitalization:   New Problems/Goals identified:    Discharge Plan or Barriers:   Home with outpatient follow up [facility]  Additional Comments:  N/A  Attendees:  Patient:  [name] 01/30/2013 9:40 AM   Signature: Mervyn Gay, MD 01/30/2013 9:40 AM  Signature:  Roswell Miners, RN 01/30/2013 9:40 AM  Signature:  Elliot Cousin, RN 01/30/2013 9:40 AM  Signature: 01/30/2013 9:40 AM  Signature:  01/30/2013 9:40 AM  Signature:  Juline Patch, LCSW 01/30/2013 9:40 AM  Signature:  [name], LCSW 01/30/2013 9:40 AM  Signature:  Maseta Dorley,Care Coordinator 01/30/2013 9:40 AM  Signature:   01/30/2013 9:40 AM  Signature:  01/30/2013  9:40 AM  Signature:    01/30/2013  9:40 AM  Signature:  01/30/2013  9:40 AM    Scribe for Treatment Team:   Juline Patch,  01/30/2013 9:40 AM

## 2013-02-04 NOTE — Progress Notes (Signed)
Patient Discharge Instructions:  After Visit Summary (AVS):   Faxed to:  02/04/13 Discharge Summary Note:   Faxed to:  02/04/13 Psychiatric Admission Assessment Note:   Faxed to:  02/04/13 Suicide Risk Assessment - Discharge Assessment:   Faxed to:  02/04/13 Faxed/Sent to the Next Level Care provider:  02/04/13 Faxed to [facility] @ [phone]  Jerelene Redden, 02/04/2013, 3:04 PM

## 2013-08-28 ENCOUNTER — Encounter (HOSPITAL_COMMUNITY): Payer: Self-pay | Admitting: Emergency Medicine

## 2013-08-28 ENCOUNTER — Emergency Department (HOSPITAL_COMMUNITY)
Admission: EM | Admit: 2013-08-28 | Discharge: 2013-08-28 | Disposition: A | Discharge status: Home / Self Care | Payer: Self-pay | Attending: Emergency Medicine | Admitting: Emergency Medicine

## 2013-08-28 DIAGNOSIS — F32A Depression, unspecified: Secondary | ICD-10-CM

## 2013-08-28 DIAGNOSIS — F430 Acute stress reaction: Secondary | ICD-10-CM | POA: Insufficient documentation

## 2013-08-28 DIAGNOSIS — Z8619 Personal history of other infectious and parasitic diseases: Secondary | ICD-10-CM | POA: Insufficient documentation

## 2013-08-28 DIAGNOSIS — F1910 Other psychoactive substance abuse, uncomplicated: Secondary | ICD-10-CM

## 2013-08-28 DIAGNOSIS — F1210 Cannabis abuse, uncomplicated: Secondary | ICD-10-CM | POA: Insufficient documentation

## 2013-08-28 DIAGNOSIS — F17200 Nicotine dependence, unspecified, uncomplicated: Secondary | ICD-10-CM | POA: Insufficient documentation

## 2013-08-28 DIAGNOSIS — F329 Major depressive disorder, single episode, unspecified: Secondary | ICD-10-CM | POA: Insufficient documentation

## 2013-08-28 DIAGNOSIS — F1310 Sedative, hypnotic or anxiolytic abuse, uncomplicated: Secondary | ICD-10-CM | POA: Insufficient documentation

## 2013-08-28 DIAGNOSIS — F1010 Alcohol abuse, uncomplicated: Secondary | ICD-10-CM | POA: Insufficient documentation

## 2013-08-28 DIAGNOSIS — F3289 Other specified depressive episodes: Secondary | ICD-10-CM | POA: Insufficient documentation

## 2013-08-28 DIAGNOSIS — F411 Generalized anxiety disorder: Secondary | ICD-10-CM | POA: Insufficient documentation

## 2013-08-28 HISTORY — DX: Other psychoactive substance abuse, uncomplicated: F19.10

## 2013-08-28 LAB — CBC WITH DIFFERENTIAL/PLATELET
BASOS ABS: 0 10*3/uL (ref 0.0–0.1)
Basophils Relative: 0 % (ref 0–1)
Eosinophils Absolute: 0.2 10*3/uL (ref 0.0–0.7)
Eosinophils Relative: 2 % (ref 0–5)
HCT: 44.8 % (ref 39.0–52.0)
HEMOGLOBIN: 15.6 g/dL (ref 13.0–17.0)
LYMPHS PCT: 45 % (ref 12–46)
Lymphs Abs: 3.9 10*3/uL (ref 0.7–4.0)
MCH: 30.9 pg (ref 26.0–34.0)
MCHC: 34.8 g/dL (ref 30.0–36.0)
MCV: 88.7 fL (ref 78.0–100.0)
Monocytes Absolute: 0.5 10*3/uL (ref 0.1–1.0)
Monocytes Relative: 6 % (ref 3–12)
NEUTROS ABS: 4.1 10*3/uL (ref 1.7–7.7)
Neutrophils Relative %: 47 % (ref 43–77)
Platelets: 263 10*3/uL (ref 150–400)
RBC: 5.05 MIL/uL (ref 4.22–5.81)
RDW: 12.8 % (ref 11.5–15.5)
WBC: 8.6 10*3/uL (ref 4.0–10.5)

## 2013-08-28 LAB — ETHANOL: ALCOHOL ETHYL (B): 168 mg/dL — AB (ref 0–11)

## 2013-08-28 LAB — COMPREHENSIVE METABOLIC PANEL
ALK PHOS: 70 U/L (ref 39–117)
ALT: 73 U/L — AB (ref 0–53)
AST: 44 U/L — AB (ref 0–37)
Albumin: 4.2 g/dL (ref 3.5–5.2)
BILIRUBIN TOTAL: 0.8 mg/dL (ref 0.3–1.2)
BUN: 10 mg/dL (ref 6–23)
CHLORIDE: 105 meq/L (ref 96–112)
CO2: 20 mEq/L (ref 19–32)
CREATININE: 0.85 mg/dL (ref 0.50–1.35)
Calcium: 9.3 mg/dL (ref 8.4–10.5)
GFR calc Af Amer: >90 mL/min (ref >90)
GFR calc non Af Amer: >90 mL/min (ref >90)
Glucose, Bld: 120 mg/dL — ABNORMAL HIGH (ref 70–99)
POTASSIUM: 3.5 meq/L — AB (ref 3.7–5.3)
Sodium: 142 mEq/L (ref 137–147)
Total Protein: 7.4 g/dL (ref 6.0–8.3)

## 2013-08-28 LAB — RAPID URINE DRUG SCREEN, HOSP PERFORMED
Amphetamines: NOT DETECTED
BARBITURATES: NOT DETECTED
Benzodiazepines: POSITIVE — AB
COCAINE: NOT DETECTED
Opiates: NOT DETECTED
Tetrahydrocannabinol: POSITIVE — AB

## 2013-08-28 LAB — ACETAMINOPHEN LEVEL: Acetaminophen (Tylenol), Serum: <15 ug/mL (ref 10–30)

## 2013-08-28 LAB — SALICYLATE LEVEL: Salicylate Lvl: <2 mg/dL — ABNORMAL LOW (ref 2.8–20.0)

## 2013-08-28 MED ORDER — ONDANSETRON 4 MG PO TBDP: 4.0000 mg | ORAL_TABLET | Freq: Four times a day (QID) | ORAL | Status: DC | PRN

## 2013-08-28 MED ORDER — CLONIDINE HCL 0.1 MG PO TABS: 0.1000 mg | ORAL_TABLET | Freq: Every day | ORAL | Status: DC

## 2013-08-28 MED ORDER — LOPERAMIDE HCL 2 MG PO CAPS: 2.0000 mg | ORAL_CAPSULE | ORAL | Status: DC | PRN

## 2013-08-28 MED ORDER — DIPHENHYDRAMINE HCL 25 MG PO CAPS
50.0000 mg | ORAL_CAPSULE | Freq: Once | ORAL | Status: DC
  Filled 2013-08-28: qty 2

## 2013-08-28 MED ORDER — CLONIDINE HCL 0.1 MG PO TABS: 0.1000 mg | ORAL_TABLET | Freq: Four times a day (QID) | ORAL | Status: DC

## 2013-08-28 MED ORDER — METHOCARBAMOL 500 MG PO TABS: 500.0000 mg | ORAL_TABLET | Freq: Three times a day (TID) | ORAL | Status: DC | PRN

## 2013-08-28 MED ORDER — ONDANSETRON 4 MG PO TBDP: 8.0000 mg | ORAL_TABLET | Freq: Once | ORAL | Status: DC

## 2013-08-28 MED ORDER — DICYCLOMINE HCL 20 MG PO TABS: 20.0000 mg | ORAL_TABLET | Freq: Four times a day (QID) | ORAL | Status: DC | PRN

## 2013-08-28 MED ORDER — DIPHENHYDRAMINE HCL 50 MG/ML IJ SOLN
50.0000 mg | Freq: Once | INTRAMUSCULAR | Status: DC
Start: 2013-08-28 — End: 2013-08-28

## 2013-08-28 MED ORDER — CLONIDINE HCL 0.1 MG PO TABS: 0.1000 mg | ORAL_TABLET | ORAL | Status: DC

## 2013-08-28 MED ORDER — LORAZEPAM 2 MG/ML IJ SOLN
1.0000 mg | Freq: Once | INTRAMUSCULAR | Status: AC
  Administered 2013-08-28: 1 mg via INTRAMUSCULAR
  Filled 2013-08-28: qty 1

## 2013-08-28 MED ORDER — HYDROXYZINE HCL 25 MG PO TABS: 25.0000 mg | ORAL_TABLET | Freq: Four times a day (QID) | ORAL | Status: DC | PRN

## 2013-08-28 MED ORDER — NAPROXEN 250 MG PO TABS: 500.0000 mg | ORAL_TABLET | Freq: Two times a day (BID) | ORAL | Status: DC | PRN

## 2013-08-28 NOTE — ED Notes (Signed)
PATIENT STATES THAT HE WAS UPSET YESTERDAY BECAUSE HE DIDN'T HAVE HIS SUBOXONE AND KLONOPIN. STATES HE AS BEEN ON SUBOXONE FOR ABOUT 2 YEARS. STATES HE USES 1 8MG  STRIP DAILY. STATES HE USED TO HAVE IT PRESCRIBED FOR HIM BUT LOST HIS HEALTH INSURANCE SO HE HAS A "FRIEND" GET IT FOR HIM THAT HAS HEALTH INSURANCE. HE ALSO HAS THEM GET HIS KLONOPIN. STATES HE USES ABOUT 2-3 MG KLONOPIN DAILY. STATES HE JUST STARTED A NEW JOB AND IS CONCERNED ABOUT BEING IN THE HOSPITAL BECAUSE HE HAS TO BE AT WORK IN THE MORNING AT 1030. STATES HE NEEDS TO GET HOME SO HE CAN BE SURE HE GETS HIS MEDS SECURED. STATES HE HAS BEEN DOING "THE BEST HE CAN" SINCE GETTING OUT OF [facility] October. STATES DR LUGO HAD HIS MEDICATIONS WORKED OUT AND WHEN HE WENT TO [facility] FOR FOLLOW UP THINGS GOT OUT OF SORTS BECAUSE THEY WANTED TO CHANGE EVERYTHING THAT DR LUGO HAD WORKING FOR HIM. HE DENIES SI/HI AT THIS TIME. HE IS CALM AND COOPERATIVE WITH SOME EXPRESSED ANXIETY.

## 2013-08-28 NOTE — BH Assessment (Signed)
Consulted with Fuller Canadaobert Browning-PA- to obtain clinicals prior to assessing patient.   Glorious PeachNajah Olivia Pavelko, MS, LCASA Assessment Counselor
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
## 2013-08-28 NOTE — BH Assessment (Signed)
TTS assessment complete.  Nimo Verastegui, MS, LCASA Assessment Counselor  
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
## 2013-08-28 NOTE — ED Provider Notes (Signed)
9:51 AM Patient has been seen by TTS, who recommend discharge with outpatient resources.  TTS discussed the patient with psychiatry who also agrees.   Patient originally complained to EMS of SI, but during repeated questioning throughout the night by multiple providers, he insists that he is not suicidal, but was just frustrated at the time.    As I questioned him this morning he seems reasonable and denies any SI.  He has signe a do no harm contract.  Given that multiple providers have seen the patient, and his story remains consistent, believe the patient to be stable for discharge.  Will give outpatient resources.  Patient is stable and ready for discharge.  Clinically sober.  Roxy Horsemanobert Copeland Neisen, PA-C 08/28/13 [phone]

## 2013-08-28 NOTE — ED Notes (Signed)
teleassessment in progress 

## 2013-08-28 NOTE — Discharge Instructions (Signed)

## 2013-08-28 NOTE — ED Notes (Signed)
Pt has signed the no harm contract and copy faxed to [facility] and given to pt

## 2013-08-28 NOTE — ED Notes (Signed)
BH HAS CALLED AND WILL DO PT TELEASSESSMENT AT 0815

## 2013-08-28 NOTE — BH Assessment (Signed)
Consulted with Psychiatric Extender Renata CapriceConrad Winthrow-FNP and Roxy Horsemanobert Browning ED/PA who are both in agreement that patient can be discharged from ER with a plan to F/U with outpatient provider-Dr. Dub MikesLugo for Suboxone Maintenance and [facility].   Glorious PeachNajah Linzie Criss, MS, LCASA Assessment Counselor

## 2013-08-28 NOTE — ED Notes (Signed)
Pt is from home. Pt's friends called 911 because they were afraid that the pt would harm himself. Pt is with history of suicide attempt, which most recently occurred 1 month ago. Pt with hx of anxiety of substance abuse (opiates which he describes as oxycodone, roxycodone, and fentanyl patches). Pt also with ETOH on board. Pt states that he has not been taking his prescribed meds x 1-2 months, due to inability to afford them. Pt with hx of substance abuse, anxiety and HI.

## 2013-08-28 NOTE — ED Provider Notes (Signed)
Medical screening examination/treatment/procedure(s) were performed by non-physician practitioner and as supervising physician I was immediately available for consultation/collaboration.   EKG Interpretation None       Sunnie NielsenBrian Kambrey Hagger, MD 08/28/13 [phone]

## 2013-08-29 NOTE — ED Provider Notes (Signed)
Medical screening examination/treatment/procedure(s) were performed by non-physician practitioner and as supervising physician I was immediately available for consultation/collaboration.   EKG Interpretation None       Darvin Dials, MD 08/29/13 2303 

## 2014-03-08 ENCOUNTER — Emergency Department (HOSPITAL_COMMUNITY)
Admission: EM | Admit: 2014-03-08 | Discharge: 2014-03-09 | Disposition: A | Discharge status: Other Acute Inpatient Hospital | Payer: Federal, State, Local not specified - Other | Attending: Emergency Medicine | Admitting: Emergency Medicine

## 2014-03-08 ENCOUNTER — Encounter (HOSPITAL_COMMUNITY): Payer: Self-pay | Admitting: Emergency Medicine

## 2014-03-08 DIAGNOSIS — R45851 Suicidal ideations: Secondary | ICD-10-CM

## 2014-03-08 DIAGNOSIS — F329 Major depressive disorder, single episode, unspecified: Secondary | ICD-10-CM | POA: Diagnosis present

## 2014-03-08 DIAGNOSIS — Z8619 Personal history of other infectious and parasitic diseases: Secondary | ICD-10-CM | POA: Insufficient documentation

## 2014-03-08 DIAGNOSIS — Z720 Tobacco use: Secondary | ICD-10-CM | POA: Insufficient documentation

## 2014-03-08 DIAGNOSIS — F410 Panic disorder [episodic paroxysmal anxiety] without agoraphobia: Secondary | ICD-10-CM | POA: Insufficient documentation

## 2014-03-08 DIAGNOSIS — F1010 Alcohol abuse, uncomplicated: Secondary | ICD-10-CM | POA: Diagnosis present

## 2014-03-08 DIAGNOSIS — F322 Major depressive disorder, single episode, severe without psychotic features: Secondary | ICD-10-CM | POA: Insufficient documentation

## 2014-03-08 DIAGNOSIS — F1110 Opioid abuse, uncomplicated: Secondary | ICD-10-CM | POA: Insufficient documentation

## 2014-03-08 DIAGNOSIS — F1410 Cocaine abuse, uncomplicated: Secondary | ICD-10-CM | POA: Insufficient documentation

## 2014-03-08 LAB — CBC
HEMATOCRIT: 44.1 % (ref 39.0–52.0)
Hemoglobin: 15.3 g/dL (ref 13.0–17.0)
MCH: 30.8 pg (ref 26.0–34.0)
MCHC: 34.7 g/dL (ref 30.0–36.0)
MCV: 88.9 fL (ref 78.0–100.0)
PLATELETS: 319 10*3/uL (ref 150–400)
RBC: 4.96 MIL/uL (ref 4.22–5.81)
RDW: 13.2 % (ref 11.5–15.5)
WBC: 9.5 10*3/uL (ref 4.0–10.5)

## 2014-03-08 LAB — ACETAMINOPHEN LEVEL: Acetaminophen (Tylenol), Serum: <15 ug/mL (ref 10–30)

## 2014-03-08 LAB — COMPREHENSIVE METABOLIC PANEL
ALBUMIN: 4.1 g/dL (ref 3.5–5.2)
ALK PHOS: 86 U/L (ref 39–117)
ALT: 50 U/L (ref 0–53)
AST: 41 U/L — AB (ref 0–37)
Anion gap: 12 (ref 5–15)
BILIRUBIN TOTAL: 1 mg/dL (ref 0.3–1.2)
BUN: 14 mg/dL (ref 6–23)
CHLORIDE: 97 meq/L (ref 96–112)
CO2: 27 mEq/L (ref 19–32)
Calcium: 9.5 mg/dL (ref 8.4–10.5)
Creatinine, Ser: 0.85 mg/dL (ref 0.50–1.35)
GFR calc Af Amer: >90 mL/min (ref >90)
GFR calc non Af Amer: >90 mL/min (ref >90)
Glucose, Bld: 92 mg/dL (ref 70–99)
POTASSIUM: 3.8 meq/L (ref 3.7–5.3)
Sodium: 136 mEq/L — ABNORMAL LOW (ref 137–147)
Total Protein: 7.8 g/dL (ref 6.0–8.3)

## 2014-03-08 LAB — RAPID URINE DRUG SCREEN, HOSP PERFORMED
Amphetamines: NOT DETECTED
BARBITURATES: NOT DETECTED
Benzodiazepines: NOT DETECTED
COCAINE: POSITIVE — AB
Opiates: POSITIVE — AB
TETRAHYDROCANNABINOL: NOT DETECTED

## 2014-03-08 LAB — SALICYLATE LEVEL: Salicylate Lvl: <2 mg/dL — ABNORMAL LOW (ref 2.8–20.0)

## 2014-03-08 LAB — ETHANOL: Alcohol, Ethyl (B): <11 mg/dL (ref 0–11)

## 2014-03-08 MED ORDER — ONDANSETRON HCL 4 MG PO TABS: 4.0000 mg | ORAL_TABLET | Freq: Three times a day (TID) | ORAL | Status: DC | PRN

## 2014-03-08 MED ORDER — TRAZODONE HCL 50 MG PO TABS: 50.0000 mg | ORAL_TABLET | Freq: Every evening | ORAL | Status: DC | PRN

## 2014-03-08 MED ORDER — NICOTINE 21 MG/24HR TD PT24: 21.0000 mg | MEDICATED_PATCH | Freq: Every day | TRANSDERMAL | Status: DC

## 2014-03-08 MED ORDER — ACETAMINOPHEN 325 MG PO TABS
650.0000 mg | ORAL_TABLET | ORAL | Status: DC | PRN
Start: 2014-03-08 — End: 2014-03-09

## 2014-03-08 MED ORDER — HYDROXYZINE HCL 25 MG PO TABS
50.0000 mg | ORAL_TABLET | Freq: Four times a day (QID) | ORAL | Status: DC | PRN
  Administered 2014-03-08 – 2014-03-09 (×3): 50 mg via ORAL
  Filled 2014-03-08 (×3): qty 2

## 2014-03-08 MED ORDER — ALUM & MAG HYDROXIDE-SIMETH 200-200-20 MG/5ML PO SUSP: 30.0000 mL | ORAL | Status: DC | PRN

## 2014-03-08 MED ORDER — IBUPROFEN 200 MG PO TABS: 600.0000 mg | ORAL_TABLET | Freq: Three times a day (TID) | ORAL | Status: DC | PRN

## 2014-03-08 NOTE — BH Assessment (Signed)
Assessment Note  Wayne [name] is an 23 y.o. male. Patient was brought into the ED by his father because of recent attempt of suicide.  Patient reports last night drinking 4 locos and going to the 9th floor of the apartment and climb on the outside of the balcony. He reports he was about to let go the balcony to fall to his death and a man(neighbor) grabbed him saving his life.  Patient reports he been depressed for a long time but he gained the courage to follow through with his thoughts of suicide while he was intoxicated.  Patient reports being stable in the past while on celexa and buspar but not taken them in 3 months.  Patient denies current legal problems.  Patient reports his drinking or drug use is sporadic but used alcohol, pain pills, and cocaine in the last week.. Patient reports his last suicide attempt was at age 23 and [facility].    CSW consulted with Catha NottinghamJamison, NP it is recommended to refer to inpatient treatment for safety and stabilization.    Axis I: Mood Disorder NOS Axis II: Deferred Axis III:  Past Medical History  Diagnosis Date  . Hepatitis C   . Depression   . Anxiety   . Substance abuse    Axis IV: economic problems, educational problems, housing problems, occupational problems, other psychosocial or environmental problems, problems related to social environment, problems with access to health care services and problems with primary support group Axis V: 41-50 serious symptoms  Past Medical History:  Past Medical History  Diagnosis Date  . Hepatitis C   . Depression   . Anxiety   . Substance abuse     Past Surgical History  Procedure Laterality Date  . Extraction of wisdom teeth  Age 23    Family History:  Family History  Problem Relation Age of Onset  . Anxiety disorder Father   . Drug abuse Father   . Anxiety disorder Paternal Grandmother   . Bipolar disorder Paternal Aunt   . Alcohol abuse Paternal Aunt   . Anxiety disorder  Paternal Uncle     Social History:  reports that he has been smoking Cigarettes.  He has been smoking about 1.00 pack per day. He does not have any smokeless tobacco history on file. He reports that he drinks about 3.6 oz of alcohol per week. He reports that he uses illicit drugs (Marijuana, Cocaine, and Methamphetamines).  Additional Social History:     CIWA: CIWA-Ar BP: 137/81 mmHg Pulse Rate: 104 COWS:    Allergies:  Allergies  Allergen Reactions  . Wellbutrin [Bupropion] Hives    Home Medications:  (Not in a hospital admission)  OB/GYN Status:  No LMP for male patient.  General Assessment Data [facility] of [facility]: [facility] ACT Assessment: Yes Is this a Tele or Face-to-Face Assessment?: Face-to-Face Is this an Initial Assessment or a Re-assessment for this encounter?: Initial Assessment Living Arrangements: Other relatives Can pt return to current living arrangement?: No Admission Status: Voluntary Is patient capable of signing voluntary admission?: Yes Transfer from: Home Referral Source: Self/Family/Friend  Medical Screening Exam [facility] Walk-in ONLY) Medical Exam completed: Yes  [facility] Living Arrangements: Other relatives Name of Psychiatrist: none  Education Status Is patient currently in school?: No  Risk to self with the past 6 months Suicidal Ideation: Yes-Currently Present Suicidal Intent: Yes-Currently Present Is patient at risk for suicide?: Yes Suicidal Plan?: Yes-Currently Present Specify Current Suicidal Plan: jump of  balcony Access to Means: Yes What has been your use of drugs/alcohol within the last 12 months?: cocaine, opiates, alcohol Previous Attempts/Gestures: Yes How many times?: 1 Triggers for Past Attempts: Unknown Intentional Self Injurious Behavior: None Family Suicide History: Unknown Recent stressful life event(s): Conflict (Comment), Job Loss, Loss (Comment) (SA) Persecutory voices/beliefs?: No Depression:  Yes Depression Symptoms: Tearfulness, Fatigue, Guilt, Loss of interest in usual pleasures, Feeling angry/irritable Substance abuse history and/or treatment for substance abuse?: Yes  Risk to Others within the past 6 months Homicidal Ideation: No-Not Currently/Within Last 6 Months Thoughts of Harm to Others: No-Not Currently Present/Within Last 6 Months Current Homicidal Intent: No-Not Currently/Within Last 6 Months Current Homicidal Plan: No-Not Currently/Within Last 6 Months Access to Homicidal Means: No History of harm to others?: No Assessment of Violence: None Noted Criminal Charges Pending?: No Does patient have a court date: No  Psychosis Hallucinations: None noted Delusions: None noted  Mental Status Report Appear/Hygiene: In hospital gown Eye Contact: Fair Motor Activity: Unremarkable Speech: Logical/coherent Level of Consciousness: Alert Mood: Depressed Affect: Depressed Anxiety Level: Minimal Thought Processes: Coherent Judgement: Unimpaired Orientation: Person, Place, Time, Situation Obsessive Compulsive Thoughts/Behaviors: None  Cognitive Functioning Concentration: Fair Memory: Recent Intact, Remote Intact IQ: Average Insight: Fair Impulse Control: Fair Appetite: Fair Sleep: No Change Vegetative Symptoms: None  ADLScreening [facility] Assessment Services) Patient's cognitive ability adequate to safely complete daily activities?: Yes Patient able to express need for assistance with ADLs?: Yes Independently performs ADLs?: Yes (appropriate for developmental age)  Prior Inpatient Therapy Prior Inpatient Therapy: Yes Prior Therapy Facilty/Provider(s): [facility] [place] Reason for Treatment: SI     ADL Screening (condition at time of admission) Patient's cognitive ability adequate to safely complete daily activities?: Yes Patient able to express need for assistance with ADLs?: Yes Independently performs ADLs?: Yes (appropriate for developmental age)              Advance Directives (For Healthcare) Does patient have an advance directive?: No    Additional Information 1:1 In Past 12 Months?: No CIRT Risk: No Elopement Risk: No Does patient have medical clearance?: Yes     Disposition:  Disposition Initial Assessment Completed for this Encounter: Yes Disposition of Patient: Inpatient treatment program Type of inpatient treatment program: Adult  On Site Evaluation by:   Reviewed with Physician:    Maryelizabeth Rowanorbett, Rolondo Pierre A 03/08/2014 7:21 PM

## 2014-03-08 NOTE — ED Notes (Signed)
TTS in w/ pt 

## 2014-03-08 NOTE — ED Provider Notes (Signed)
TIME SEEN: 3:45 PM  CHIEF COMPLAINT: Suicidal thoughts  HPI: Pt is a 23 y.o. M with history of depression, anxiety, substance abuse, prior suicide attempt at [age] ears old who presents emergency department with suicidal thoughts for the past month. Patient reports that he was standing on the ledge onset of a nonsteroidal apartment building earlier today because he wanted to jump. He reports that a neighbor found him and told him back and side. He states over the past several days he has been drinking alcohol and using "any drug that I can get my hands on".  States that he is supposed to be on psychiatric medications but has not taken them in several months. He does not have a psychiatrist or psychologist that he sees. He is here with his father voluntarily. Denies any pain. No recent infectious symptoms. No neurologic deficits.  ROS: See HPI Constitutional: no fever  Eyes: no drainage  ENT: no runny nose   Cardiovascular:  no chest pain  Resp: no SOB  GI: no vomiting GU: no dysuria Integumentary: no rash  Allergy: no hives  Musculoskeletal: no leg swelling  Neurological: no slurred speech ROS otherwise negative  PAST MEDICAL HISTORY/PAST SURGICAL HISTORY:  Past Medical History  Diagnosis Date  . Hepatitis C   . Depression   . Anxiety   . Substance abuse     MEDICATIONS:  Prior to Admission medications   Not on File    ALLERGIES:  Allergies  Allergen Reactions  . Wellbutrin [Bupropion] Hives    SOCIAL HISTORY:  History  Substance Use Topics  . Smoking status: Current Every Day Smoker -- 1.00 packs/day    Types: Cigarettes  . Smokeless tobacco: Not on file  . Alcohol Use: 3.6 oz/week    6 Glasses of wine, 7-10 Cans of beer per week     Comment: 4 to 5 times a week (as of 08/28/13, pt reports a couple times a month)    FAMILY HISTORY: Family History  Problem Relation Age of Onset  . Anxiety disorder Father   . Drug abuse Father   . Anxiety disorder Paternal  Grandmother   . Bipolar disorder Paternal Aunt   . Alcohol abuse Paternal Aunt   . Anxiety disorder Paternal Uncle     EXAM: BP 146/87 mmHg  Pulse 110  Temp(Src) 98.4 F (36.9 C) (Oral)  Resp 18  SpO2 100% CONSTITUTIONAL: Alert and oriented and responds appropriately to questions. Well-appearing; well-nourished HEAD: Normocephalic EYES: Conjunctivae clear, PERRL ENT: normal nose; no rhinorrhea; moist mucous membranes; pharynx without lesions noted NECK: Supple, no meningismus, no LAD  CARD: Regular and tachycardic; S1 and S2 appreciated; no murmurs, no clicks, no rubs, no gallops RESP: Normal chest excursion without splinting or tachypnea; breath sounds clear and equal bilaterally; no wheezes, no rhonchi, no rales,  ABD/GI: Normal bowel sounds; non-distended; soft, non-tender, no rebound, no guarding BACK:  The back appears normal and is non-tender to palpation, there is no CVA tenderness EXT: Normal ROM in all joints; non-tender to palpation; no edema; normal capillary refill; no cyanosis; no calf tenderness or swelling    SKIN: Normal color for age and race; warm NEURO: Moves all extremities equally; sensation to light touch intact diffusely, cranial nerves II through XII intact, normal gait PSYCH: Patient endorses suicidality with plan to jump off a building. No HI or hallucinations. Grooming and personal hygiene are appropriate.  MEDICAL DECISION MAKING: Pt here with suicidal thoughts.  Labs are unremarkable. Urine drug screen is  positive for cocaine and opiates. Alcohol negative. He is here voluntarily. Medically cleared. We'll discuss with psychiatry for further evaluation. I feel patient will need inpatient psychiatric treatment. Patient and father agree.  ED PROGRESS: TTS has evaluated patient and recommend inpatient placement.     Layla MawKristen N Srijan Givan, DO 03/08/14 2304

## 2014-03-08 NOTE — ED Notes (Signed)
Pt ambulatory w/o difficulty from triage to unit

## 2014-03-08 NOTE — ED Notes (Signed)
TTS into see 

## 2014-03-08 NOTE — ED Notes (Signed)
Pt states that he was made to come here by his father.  "I was hanging on to the ledge of our 9 story apartment building and our nosy ass neighbor came and pulled me back over the side.  My dad said that if I didn't come here on my own that he was going to call the law on me."  History of SI with attempts.  Attempts in the past were from OD.  Pt states that he no longer uses drugs but has hepatitis C from sharing needles.  Denies HI.

## 2014-03-09 ENCOUNTER — Encounter (HOSPITAL_COMMUNITY): Payer: Self-pay

## 2014-03-09 ENCOUNTER — Inpatient Hospital Stay (HOSPITAL_COMMUNITY)
Admission: AD | Admit: 2014-03-09 | Discharge: 2014-03-12 | DRG: 885 | Disposition: A | Discharge status: Home / Self Care | Payer: Federal, State, Local not specified - Other | Source: Intra-hospital | Attending: Psychiatry | Admitting: Psychiatry

## 2014-03-09 ENCOUNTER — Encounter (HOSPITAL_COMMUNITY): Payer: Self-pay | Admitting: Registered Nurse

## 2014-03-09 DIAGNOSIS — R45851 Suicidal ideations: Secondary | ICD-10-CM

## 2014-03-09 DIAGNOSIS — F17210 Nicotine dependence, cigarettes, uncomplicated: Secondary | ICD-10-CM | POA: Diagnosis present

## 2014-03-09 DIAGNOSIS — F4001 Agoraphobia with panic disorder: Secondary | ICD-10-CM | POA: Diagnosis present

## 2014-03-09 DIAGNOSIS — Z590 Homelessness: Secondary | ICD-10-CM

## 2014-03-09 DIAGNOSIS — Z23 Encounter for immunization: Secondary | ICD-10-CM | POA: Diagnosis not present

## 2014-03-09 DIAGNOSIS — Z638 Other specified problems related to primary support group: Secondary | ICD-10-CM | POA: Diagnosis not present

## 2014-03-09 DIAGNOSIS — Z818 Family history of other mental and behavioral disorders: Secondary | ICD-10-CM

## 2014-03-09 DIAGNOSIS — F332 Major depressive disorder, recurrent severe without psychotic features: Principal | ICD-10-CM | POA: Diagnosis present

## 2014-03-09 DIAGNOSIS — F1123 Opioid dependence with withdrawal: Secondary | ICD-10-CM | POA: Diagnosis present

## 2014-03-09 DIAGNOSIS — F1010 Alcohol abuse, uncomplicated: Secondary | ICD-10-CM | POA: Diagnosis present

## 2014-03-09 DIAGNOSIS — G4700 Insomnia, unspecified: Secondary | ICD-10-CM | POA: Diagnosis present

## 2014-03-09 DIAGNOSIS — F1124 Opioid dependence with opioid-induced mood disorder: Secondary | ICD-10-CM | POA: Insufficient documentation

## 2014-03-09 DIAGNOSIS — F1014 Alcohol abuse with alcohol-induced mood disorder: Secondary | ICD-10-CM

## 2014-03-09 MED ORDER — BUSPIRONE HCL 5 MG PO TABS
5.0000 mg | ORAL_TABLET | Freq: Two times a day (BID) | ORAL | Status: DC
  Administered 2014-03-10 – 2014-03-12 (×5): 5 mg via ORAL
  Filled 2014-03-09 (×9): qty 1

## 2014-03-09 MED ORDER — CLONIDINE HCL 0.1 MG PO TABS
0.1000 mg | ORAL_TABLET | ORAL | Status: DC
  Administered 2014-03-11 – 2014-03-12 (×2): 0.1 mg via ORAL
  Filled 2014-03-09 (×4): qty 1

## 2014-03-09 MED ORDER — METHOCARBAMOL 500 MG PO TABS: 500.0000 mg | ORAL_TABLET | Freq: Three times a day (TID) | ORAL | Status: DC | PRN

## 2014-03-09 MED ORDER — NICOTINE 21 MG/24HR TD PT24
21.0000 mg | MEDICATED_PATCH | Freq: Every day | TRANSDERMAL | Status: DC
  Filled 2014-03-09 (×2): qty 1

## 2014-03-09 MED ORDER — CLONIDINE HCL 0.1 MG PO TABS
0.1000 mg | ORAL_TABLET | Freq: Four times a day (QID) | ORAL | Status: DC
  Administered 2014-03-09: 0.1 mg via ORAL
  Filled 2014-03-09: qty 1

## 2014-03-09 MED ORDER — CITALOPRAM HYDROBROMIDE 20 MG PO TABS
20.0000 mg | ORAL_TABLET | Freq: Every day | ORAL | Status: DC
  Administered 2014-03-09: 20 mg via ORAL
  Filled 2014-03-09 (×2): qty 1

## 2014-03-09 MED ORDER — CLONIDINE HCL 0.1 MG PO TABS: 0.1000 mg | ORAL_TABLET | ORAL | Status: DC

## 2014-03-09 MED ORDER — CLONIDINE HCL 0.1 MG PO TABS: 0.1000 mg | ORAL_TABLET | Freq: Every day | ORAL | Status: DC

## 2014-03-09 MED ORDER — CLONIDINE HCL 0.1 MG PO TABS
0.1000 mg | ORAL_TABLET | Freq: Every day | ORAL | Status: DC
  Filled 2014-03-09: qty 1

## 2014-03-09 MED ORDER — DICYCLOMINE HCL 20 MG PO TABS: 20.0000 mg | ORAL_TABLET | Freq: Four times a day (QID) | ORAL | Status: DC | PRN

## 2014-03-09 MED ORDER — TRAZODONE HCL 50 MG PO TABS
50.0000 mg | ORAL_TABLET | Freq: Every evening | ORAL | Status: DC | PRN
  Administered 2014-03-09 – 2014-03-11 (×3): 50 mg via ORAL
  Filled 2014-03-09 (×3): qty 1
  Filled 2014-03-09: qty 14

## 2014-03-09 MED ORDER — INFLUENZA VAC SPLIT QUAD 0.5 ML IM SUSY
0.5000 mL | PREFILLED_SYRINGE | INTRAMUSCULAR | Status: AC
  Administered 2014-03-11: 0.5 mL via INTRAMUSCULAR
  Filled 2014-03-09: qty 0.5

## 2014-03-09 MED ORDER — CITALOPRAM HYDROBROMIDE 20 MG PO TABS
20.0000 mg | ORAL_TABLET | Freq: Every day | ORAL | Status: DC
  Administered 2014-03-10 – 2014-03-12 (×3): 20 mg via ORAL
  Filled 2014-03-09 (×5): qty 1

## 2014-03-09 MED ORDER — ALUM & MAG HYDROXIDE-SIMETH 200-200-20 MG/5ML PO SUSP: 30.0000 mL | ORAL | Status: DC | PRN

## 2014-03-09 MED ORDER — LOPERAMIDE HCL 2 MG PO CAPS
2.0000 mg | ORAL_CAPSULE | ORAL | Status: DC | PRN
  Administered 2014-03-09: 4 mg via ORAL
  Filled 2014-03-09: qty 2

## 2014-03-09 MED ORDER — CLONIDINE HCL 0.1 MG PO TABS
0.1000 mg | ORAL_TABLET | Freq: Four times a day (QID) | ORAL | Status: AC
  Administered 2014-03-09 – 2014-03-11 (×8): 0.1 mg via ORAL
  Filled 2014-03-09 (×9): qty 1

## 2014-03-09 MED ORDER — MAGNESIUM HYDROXIDE 400 MG/5ML PO SUSP: 30.0000 mL | Freq: Every day | ORAL | Status: DC | PRN

## 2014-03-09 MED ORDER — ONDANSETRON HCL 4 MG PO TABS: 4.0000 mg | ORAL_TABLET | Freq: Three times a day (TID) | ORAL | Status: DC | PRN

## 2014-03-09 MED ORDER — HYDROXYZINE HCL 50 MG PO TABS
50.0000 mg | ORAL_TABLET | Freq: Four times a day (QID) | ORAL | Status: DC | PRN
  Administered 2014-03-09 – 2014-03-11 (×4): 50 mg via ORAL
  Filled 2014-03-09 (×4): qty 1

## 2014-03-09 MED ORDER — NAPROXEN 500 MG PO TABS: 500.0000 mg | ORAL_TABLET | Freq: Two times a day (BID) | ORAL | Status: DC | PRN

## 2014-03-09 MED ORDER — BUSPIRONE HCL 10 MG PO TABS
5.0000 mg | ORAL_TABLET | Freq: Two times a day (BID) | ORAL | Status: DC
  Administered 2014-03-09: 5 mg via ORAL
  Filled 2014-03-09: qty 1

## 2014-03-09 MED ORDER — LOPERAMIDE HCL 2 MG PO CAPS: 2.0000 mg | ORAL_CAPSULE | ORAL | Status: DC | PRN

## 2014-03-09 MED ORDER — PNEUMOCOCCAL VAC POLYVALENT 25 MCG/0.5ML IJ INJ
0.5000 mL | INJECTION | INTRAMUSCULAR | Status: AC
Start: 2014-03-10 — End: 2014-03-11
  Administered 2014-03-11: 0.5 mL via INTRAMUSCULAR

## 2014-03-09 NOTE — ED Notes (Signed)
Up to the bathroom-reports diarrhea

## 2014-03-09 NOTE — Tx Team (Signed)
Initial Interdisciplinary Treatment Plan   Foster STRESSORS: Financial difficulties Loss of employment Substance abuse   Foster STRENGTHS: Ability for insight Average or above average intelligence Motivation for treatment/growth Supportive family/friends   PROBLEM LIST: Problem List/Foster Goals Date to be addressed Date deferred Reason deferred Estimated date of resolution  Depression  03/09/2014     anxiety 03/09/2014     Substance abuse 03/09/2014                                          DISCHARGE CRITERIA:  Improved stabilization in mood, thinking, and/or behavior Motivation to continue treatment in a less acute level of care Verbal commitment to aftercare and medication compliance  PRELIMINARY DISCHARGE PLAN: Outpatient therapy Return to previous living arrangement  Foster/FAMIILY INVOLVEMENT: This treatment plan has been presented to and reviewed with [name], [name], and/or family member, [name] and family have been given Wayne opportunity to ask questions and make suggestions.  JEHU-APPIAH, [name] 03/09/2014, 11:49 PM

## 2014-03-09 NOTE — ED Notes (Signed)
PO fluids encourageded

## 2014-03-09 NOTE — ED Notes (Addendum)
Up to the bathroom 

## 2014-03-09 NOTE — ED Notes (Signed)
Up tot he bathroom to shower and change scrubs 

## 2014-03-09 NOTE — BH Assessment (Signed)
[facility] Assessment Progress Note    The following facilities were contacted in an attempt to place the pt:  Referral faxed for review: [facility] per [facility] [place]beds per Dannielle [facility]-at capacity per Pattricia BossAnnie, but referral faxed for possible wait list [place] per Alinda Moneyony, but referral faxed for possible wait list [facility]-beds per [facility]-beds per Tammy [facility] per [facility] - send [facility] [place] [facility] [place] per [facility] per Marylene LandAngela [facility]-beds per Romana JuniperSuzie  No answer: Turner [place]owan at [employer]-left message [facility] at [phone] [place] at [phone] [place] at [employer] [place] at 0940  At Capacity/No beds: [facility] [place] per [place]  TTS will continue to seek placement for the pt.  Casimer LaniusKristen Filomeno Cromley, MS, [facility] Licensed Professional Counselor Therapeutic Triage Specialist [facility] Phone: [phone] Fax: [phone]

## 2014-03-09 NOTE — ED Notes (Addendum)
Pt up in the hall ready for transport pulse increased 136-156 bpm, pt nervouse about transport, encouraged to relax, PO given, support given. No pain/discomfort

## 2014-03-09 NOTE — ED Notes (Addendum)
[facility] updated-transfer pt after [phone]

## 2014-03-09 NOTE — ED Notes (Signed)
Up to the bathroom 

## 2014-03-09 NOTE — BH Assessment (Signed)
[facility] Assessment Progress Note 03/09/2014-Accepted to [facility]'s waitlist per Pattricia BossAnnie, who says they should have beds tomorrow.

## 2014-03-09 NOTE — ED Notes (Signed)
Dr Loretha StaplerWofford into eval

## 2014-03-09 NOTE — ED Notes (Addendum)
[facility]

## 2014-03-09 NOTE — ED Notes (Signed)
Pt reports that he has had diarrhea during the day.  Pt encouraged to notify staff if it reoccurrs.

## 2014-03-09 NOTE — ED Provider Notes (Signed)
6:17 PM I was asked to evaluate this patient when he was found to be tachycardic prior to his admission to the behavioral [facility]. Patient was well appearing and nontoxic. He appeared slightly anxious. He reports symptoms of opiate withdrawal such as nausea and diarrhea. He does not appear dehydrated. He is tolerating by mouth's. He appears stable for admission for further treatment of his opioid withdrawal.  Warnell Foresterrey Khira Cudmore, MD 03/09/14 1818

## 2014-03-09 NOTE — ED Notes (Signed)
Up on the phone 

## 2014-03-09 NOTE — Progress Notes (Signed)
Patient ID: Wayne [name], male   DOB: 11/04/1990, 23 y.o.   MRN: [id number] Admission note: D:Patient is a voluntary admission in no acute distress for substance abuse, depression, and anxiety. Pt reports he stopped taking his medications for a few months because he was unable to afford them. Pt reports increase anxiety and attempted suicide by jumping out off a window at his apartment. Pt reports he also loss his job due to lack of transportation. 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 Pt denies SI/HI/AVH and pain upon admission. Pt manual pulse was 120 bpm.  A: Pt admitted to unit per protocol, skin assessment and belonging search done. No skin issues noted. Consent signed by pt. Pt educated on therapeutic milieu rules. Pt was introduced to milieu by nursing staff. Fall risk safety plan explained to the patient. Medication administered for anxiety and sleep. 15 minutes checks started for safety.  R: Pt was receptive to education. Writer offered support.

## 2014-03-09 NOTE — Consult Note (Signed)
[facility] Face-to-Face Psychiatry Consult   Reason for Consult:  Suicide attempt Referring Physician:  EDP  [name] is an 23 y.o. male. Total Time spent with patient: 45 minutes  Assessment: AXIS I:  Alcohol Abuse, Major Depression, Recurrent severe, Substance Abuse and Substance Induced Mood Disorder AXIS II:  Deferred AXIS III:   Past Medical History  Diagnosis Date  . Hepatitis C   . Depression   . Anxiety   . Substance abuse    AXIS IV:  other psychosocial or environmental problems and problems related to social environment AXIS V:  11-20 some danger of hurting self or others possible OR occasionally fails to maintain minimal personal hygiene OR gross impairment in communication  Plan:  Recommend psychiatric Inpatient admission when medically cleared.  Subjective:   [name] is a 23 y.o. male patient presents to [facility] after trying to commit suicide by trying to jump from ninth flow of apartment building.  HPI:  Patient states that he had an episode with his girlfriend "and after drinking; I tried to jump off of the apartment complex."  Patient continues to endorse suicidal ideation and depression.  Patient denies homicidal ideation, psychosis, and paranoia.  Patient states that he does have a psychiatric history with inpatient/outpatient and psychotropic meds.  Patient is not taking any medications at this time.    HPI Elements:   Location:  opiate abuse. Quality:  suicidal ideation. Severity:  suicide attempt. Timing:  1 day. Review of Systems  Neurological: Negative for seizures.  Psychiatric/Behavioral: Positive for depression, suicidal ideas and substance abuse. Negative for hallucinations and memory loss. The patient is nervous/anxious. The patient does not have insomnia.   All other systems reviewed and are negative.   Past Psychiatric History: Past Medical History  Diagnosis Date  . Hepatitis C   . Depression   . Anxiety   . Substance abuse     reports that  he has been smoking Cigarettes.  He has been smoking about 1.00 pack per day. He does not have any smokeless tobacco history on file. He reports that he drinks about 3.6 oz of alcohol per week. He reports that he uses illicit drugs (Marijuana, Cocaine, and Methamphetamines). Family History  Problem Relation Age of Onset  . Anxiety disorder Father   . Drug abuse Father   . Anxiety disorder Paternal Grandmother   . Bipolar disorder Paternal Aunt   . Alcohol abuse Paternal Aunt   . Anxiety disorder Paternal Uncle    Family History Substance Abuse: Yes, Describe: Family Supports: Yes, List: (father) Living Arrangements: Other relatives Can pt return to current living arrangement?: No   Allergies:   Allergies  Allergen Reactions  . Wellbutrin [Bupropion] Hives    ACT Assessment Complete:  Yes:    Educational Status    Risk to Self: Risk to self with the past 6 months Suicidal Ideation: Yes-Currently Present Suicidal Intent: Yes-Currently Present Is patient at risk for suicide?: Yes Suicidal Plan?: Yes-Currently Present Specify Current Suicidal Plan: jump of balcony Access to Means: Yes What has been your use of drugs/alcohol within the last 12 months?: cocaine, opiates, alcohol Previous Attempts/Gestures: Yes How many times?: 1 Triggers for Past Attempts: Unknown Intentional Self Injurious Behavior: None Family Suicide History: Unknown Recent stressful life event(s): Conflict (Comment), Job Loss, Loss (Comment) (SA) Persecutory voices/beliefs?: No Depression: Yes Depression Symptoms: Tearfulness, Fatigue, Guilt, Loss of interest in usual pleasures, Feeling angry/irritable Substance abuse history and/or treatment for substance abuse?: Yes  Risk to Others:  Risk to Others within the past 6 months Homicidal Ideation: No-Not Currently/Within Last 6 Months Thoughts of Harm to Others: No-Not Currently Present/Within Last 6 Months Current Homicidal Intent: No-Not Currently/Within Last  6 Months Current Homicidal Plan: No-Not Currently/Within Last 6 Months Access to Homicidal Means: No History of harm to others?: No Assessment of Violence: None Noted Criminal Charges Pending?: No Does patient have a court date: No  Abuse:    Prior Inpatient Therapy: Prior Inpatient Therapy Prior Inpatient Therapy: Yes Prior Therapy Facilty/Provider(s): [facility] [place] Reason for Treatment: SI  Prior Outpatient Therapy:    Additional Information: Additional Information 1:1 In Past 12 Months?: No CIRT Risk: No Elopement Risk: No Does patient have medical clearance?: Yes                  Objective: Blood pressure 131/82, pulse 110, temperature 98.9 F (37.2 C), temperature source Oral, resp. rate 19, SpO2 98 %.There is no weight on file to calculate BMI. Results for orders placed or performed during the hospital encounter of 03/08/14 (from the past 72 hour(s))  Acetaminophen level     Status: None   Collection Time: 03/08/14  2:24 PM  Result Value Ref Range   Acetaminophen (Tylenol), Serum <15.0 10 - 30 ug/mL    Comment:        THERAPEUTIC CONCENTRATIONS VARY SIGNIFICANTLY. A RANGE OF 10-30 ug/mL MAY BE AN EFFECTIVE CONCENTRATION FOR MANY PATIENTS. HOWEVER, SOME ARE BEST TREATED AT CONCENTRATIONS OUTSIDE THIS RANGE. ACETAMINOPHEN CONCENTRATIONS >150 ug/mL AT 4 HOURS AFTER INGESTION AND >50 ug/mL AT 12 HOURS AFTER INGESTION ARE OFTEN ASSOCIATED WITH TOXIC REACTIONS.   CBC     Status: None   Collection Time: 03/08/14  2:24 PM  Result Value Ref Range   WBC 9.5 4.0 - 10.5 K/uL   RBC 4.96 4.22 - 5.81 MIL/uL   Hemoglobin 15.3 13.0 - 17.0 g/dL   HCT 44.1 39.0 - 52.0 %   MCV 88.9 78.0 - 100.0 fL   MCH 30.8 26.0 - 34.0 pg   MCHC 34.7 30.0 - 36.0 g/dL   RDW 13.2 11.5 - 15.5 %   Platelets 319 150 - 400 K/uL  Comprehensive metabolic panel     Status: Abnormal   Collection Time: 03/08/14  2:24 PM  Result Value Ref Range   Sodium 136 (L) 137 - 147 mEq/L    Potassium 3.8 3.7 - 5.3 mEq/L   Chloride 97 96 - 112 mEq/L   CO2 27 19 - 32 mEq/L   Glucose, Bld 92 70 - 99 mg/dL   BUN 14 6 - 23 mg/dL   Creatinine, Ser 0.85 0.50 - 1.35 mg/dL   Calcium 9.5 8.4 - 10.5 mg/dL   Total Protein 7.8 6.0 - 8.3 g/dL   Albumin 4.1 3.5 - 5.2 g/dL   AST 41 (H) 0 - 37 U/L    Comment: SLIGHT HEMOLYSIS HEMOLYSIS AT THIS LEVEL MAY AFFECT RESULT    ALT 50 0 - 53 U/L   Alkaline Phosphatase 86 39 - 117 U/L   Total Bilirubin 1.0 0.3 - 1.2 mg/dL   GFR calc non Af Amer >90 >90 mL/min   GFR calc Af Amer >90 >90 mL/min    Comment: (NOTE) The eGFR has been calculated using the CKD EPI equation. This calculation has not been validated in all clinical situations. eGFR's persistently <90 mL/min signify possible Chronic Kidney Disease.    Anion gap 12 5 - 15  Ethanol (ETOH)     Status:  None   Collection Time: 03/08/14  2:24 PM  Result Value Ref Range   Alcohol, Ethyl (B) <11 0 - 11 mg/dL    Comment:        LOWEST DETECTABLE LIMIT FOR SERUM ALCOHOL IS 11 mg/dL FOR MEDICAL PURPOSES ONLY   Salicylate level     Status: Abnormal   Collection Time: 03/08/14  2:24 PM  Result Value Ref Range   Salicylate Lvl <6.7 (L) 2.8 - 20.0 mg/dL  Urine Drug Screen     Status: Abnormal   Collection Time: 03/08/14  2:26 PM  Result Value Ref Range   Opiates POSITIVE (A) NONE DETECTED   Cocaine POSITIVE (A) NONE DETECTED   Benzodiazepines NONE DETECTED NONE DETECTED   Amphetamines NONE DETECTED NONE DETECTED   Tetrahydrocannabinol NONE DETECTED NONE DETECTED   Barbiturates NONE DETECTED NONE DETECTED    Comment:        DRUG SCREEN FOR MEDICAL PURPOSES ONLY.  IF CONFIRMATION IS NEEDED FOR ANY PURPOSE, NOTIFY LAB WITHIN 5 DAYS.        LOWEST DETECTABLE LIMITS FOR URINE DRUG SCREEN Drug Class       Cutoff (ng/mL) Amphetamine      1000 Barbiturate      200 Benzodiazepine   209 Tricyclics       470 Opiates          300 Cocaine          300 THC              50    Labs are  reviewed see values above.  Medications reviewed.  Will start Clonidine detox protocol.  Will also start   Current Facility-Administered Medications  Medication Dose Route Frequency Provider Last Rate Last Dose  . acetaminophen (TYLENOL) tablet 650 mg  650 mg Oral Q4H PRN Kristen N Ward, DO      . alum & mag hydroxide-simeth (MAALOX/MYLANTA) 200-200-20 MG/5ML suspension 30 mL  30 mL Oral PRN Kristen N Ward, DO      . hydrOXYzine (ATARAX/VISTARIL) tablet 50 mg  50 mg Oral Q6H PRN Lurena Nida, NP   50 mg at 03/09/14 1429  . ibuprofen (ADVIL,MOTRIN) tablet 600 mg  600 mg Oral Q8H PRN Kristen N Ward, DO      . nicotine (NICODERM CQ - dosed in mg/24 hours) patch 21 mg  21 mg Transdermal Daily Kristen N Ward, DO   21 mg at 03/08/14 1821  . ondansetron (ZOFRAN) tablet 4 mg  4 mg Oral Q8H PRN Kristen N Ward, DO      . traZODone (DESYREL) tablet 50 mg  50 mg Oral QHS PRN Lurena Nida, NP       No current outpatient prescriptions on file.    Psychiatric Specialty Exam:     Blood pressure 131/82, pulse 110, temperature 98.9 F (37.2 C), temperature source Oral, resp. rate 19, SpO2 98 %.There is no weight on file to calculate BMI.  General Appearance: Casual  Eye Contact::  Good  Speech:  Clear and Coherent and Normal Rate  Volume:  Normal  Mood:  Depressed  Affect:  Congruent  Thought Process:  Circumstantial and Goal Directed  Orientation:  Full (Time, Place, and Person)  Thought Content:  Rumination  Suicidal Thoughts:  Yes.  with intent/plan  Homicidal Thoughts:  No  Memory:  Immediate;   Good Recent;   Good Remote;   Good  Judgement:  Poor  Insight:  Lacking  Psychomotor Activity:  Normal  Concentration:  Fair  Recall:  Roel Cluck of Knowledge:Fair  Language: Good  Akathisia:  No  Handed:  Right  AIMS (if indicated):     Assets:  Communication Skills Desire for Improvement  Sleep:      Musculoskeletal: Strength & Muscle Tone: within normal limits Gait & Station:  normal Patient leans: N/A  Treatment Plan Summary: Daily contact with patient to assess and evaluate symptoms and progress in treatment Medication management Recommend inpatient treatment for stabilization  Clonidine Protocol for opiate detox Restart Celexa 20 mg daily and Buspar 5 mg Bid   Patient accepted to [facility] for inpatient treatment  Earleen Newport, FNP-BC 03/09/2014 2:55 PM  Patient seen, evaluated and I agree with notes by Nurse Practitioner. Corena Pilgrim, MD

## 2014-03-09 NOTE — ED Notes (Signed)
[facility] Bas[facility] JumboWofford-encourage pt to drink plenty of fluids

## 2014-03-09 NOTE — BH Assessment (Addendum)
[facility]  They accept pt effective tomorrow anytime after 10 am.  Per Christiane HaJonathan at [facility]: Receiving Dr. Is Dr. Betti Cruzeddy Reporting number is [phone]  [facility] and [facility].  Beryle FlockMary Juanda Luba, MS, CRC, [facility] Triage Specialist [facility]

## 2014-03-09 NOTE — ED Notes (Signed)
Dr Akintyo and Shuvon NP into see 

## 2014-03-09 NOTE — ED Notes (Signed)
Relaxed, drinking fluids, nad

## 2014-03-09 NOTE — ED Notes (Addendum)
Dr Loretha Staplerwofford updated w/ pulse rate  and will review, [facility]

## 2014-03-10 DIAGNOSIS — F1310 Sedative, hypnotic or anxiolytic abuse, uncomplicated: Secondary | ICD-10-CM

## 2014-03-10 DIAGNOSIS — F329 Major depressive disorder, single episode, unspecified: Secondary | ICD-10-CM

## 2014-03-10 DIAGNOSIS — F1123 Opioid dependence with withdrawal: Secondary | ICD-10-CM

## 2014-03-10 DIAGNOSIS — F1124 Opioid dependence with opioid-induced mood disorder: Secondary | ICD-10-CM

## 2014-03-10 DIAGNOSIS — F1019 Alcohol abuse with unspecified alcohol-induced disorder: Secondary | ICD-10-CM

## 2014-03-10 MED ORDER — CHLORDIAZEPOXIDE HCL 25 MG PO CAPS
25.0000 mg | ORAL_CAPSULE | Freq: Four times a day (QID) | ORAL | Status: DC | PRN
  Administered 2014-03-10 – 2014-03-11 (×3): 25 mg via ORAL
  Filled 2014-03-10 (×4): qty 1

## 2014-03-10 NOTE — [facility] Group Notes (Signed)
[facility] LCSW Group Therapy 03/10/2014  1:15 pm  Type of Therapy: Group Therapy Participation Level: Active  Participation Quality: Attentive, Sharing and Supportive  Affect: Depressed and Flat  Cognitive: Alert and Oriented  Insight: Developing/Improving and Engaged  Engagement in Therapy: Developing/Improving and Engaged  Modes of Intervention: Clarification, Confrontation, Discussion, Education, Exploration,  Limit-setting, Orientation, Problem-solving, Rapport Building, Dance movement psychotherapisteality Testing, Socialization and Support  Summary of Progress/Problems: Pt identified obstacles faced currently and processed barriers involved in overcoming these obstacles. Pt identified steps necessary for overcoming these obstacles and explored motivation (internal and external) for facing these difficulties head on. Pt further identified one area of concern in their lives and chose a goal to focus on for today. Patient discussed feeling overwhelmed about his student debt, not finishing school, and difficulty finding employment due to his criminal history. Patient states that he was self-medicating to deal with the symptoms of his mental illness but that he is ready for residential treatment. CSW and other group members provided emotional support and encouragement.  Samuella BruinKristin Maddisen Vought, MSW, [employer]WA Clinical Social Worker [facility] [phone]

## 2014-03-10 NOTE — H&P (Signed)
Psychiatric Admission Assessment Adult  Patient Identification:  [name] Date of Evaluation:  03/10/2014 Chief Complaint:  " Drugs" History of Present Illness:: 23 year old male. Reports long history of substance dependence, primarily opiate dependence. Has been using IV heroin and opiate analgesics, daily, up to one gram of heroin per day.  To a lesser degree, he has also been using xanax intermittently, and has been drinking in binges, once or twice a week. He recently " was using drugs with an aunt, and I felt hopeless, and became suicidal".  He did not attempt suicide.At that point his father encouraged him to seek inpatient psychiatric care. At this time he endorses symptoms of opiate withdrawal- these include nausea, some vomiting , sweating, cramps, aches, yawning, lacrimation.  He remains depressed and anxious but currently denies any suicidal ideations. Elements: Substance Dependence which has progressed to daily IV drug use. Worsening depression and anxiety in the context of above. Associated Signs/Synptoms: Depression Symptoms:  depressed mood, anhedonia, insomnia, fatigue, feelings of worthlessness/guilt, recurrent thoughts of death, anxiety, decreased appetite, (Hypo) Manic Symptoms:  Denies manic symptoms Anxiety Symptoms: describes panic attacks and agoraphobia Psychotic Symptoms: denies  PTSD Symptoms: Does not endorse  Total Time spent with patient: 45 minutes  Psychiatric Specialty Exam: Physical Exam  Review of Systems  Constitutional: Positive for chills. Negative for fever.  Respiratory: Negative for cough and shortness of breath.   Cardiovascular: Negative for chest pain.  Gastrointestinal: Positive for nausea.  Genitourinary: Negative for dysuria, urgency and frequency.  Musculoskeletal: Positive for myalgias.  Skin: Negative for rash.  Neurological: Negative for seizures, loss of consciousness and headaches.  Psychiatric/Behavioral: Positive for  depression and substance abuse. The patient is nervous/anxious.     Blood pressure 115/72, pulse 131, temperature 98.2 F (36.8 C), temperature source Oral, resp. rate 16, height 5' 11"  (1.803 m), weight 73.936 kg (163 lb), SpO2 99 %.Body mass index is 22.74 kg/(m^2).  General Appearance: Fairly Groomed  Engineer, water::  Good  Speech:  Normal Rate  Volume:  Decreased  Mood:  Depressed  Affect:  constricted and  anxious  Thought Process:  Goal Directed and Linear  Orientation:  Full (Time, Place, and Person)  Thought Content:  denies hallucinations, no delusions  Suicidal Thoughts:  No at this time denies any thoughts of hurting self and  contracts for safety on unit   Homicidal Thoughts:  No  Memory:  Recent and Remote grossly intact  Judgement:  Fair  Insight:  Fair  Psychomotor Activity:  slight restlessness   Concentration:  Good  Recall:  Good  Fund of Knowledge:Good  Language: Good  Akathisia:  Negative  Handed:  Right  AIMS (if indicated):     Assets:  Desire for Improvement Physical Health Resilience  Sleep:  Number of Hours: 6.75    Musculoskeletal: Strength & Muscle Tone: within normal limits- no distal tremors noted Gait & Station: normal Patient leans: N/A  Past Psychiatric History: Diagnosis: patient reports long history of substance dependence- opiates have been substance of choice. Endorses depression and Panic Disorder/Agoraphobia. Does not endorse psychosis or mania   Hospitalizations: as a teenager for depression  Outpatient Care: None current  Substance Abuse Care: None current   Self-Mutilation: denies   Suicidal Attempts: denies   Violent Behaviors: denies    Past Medical History:  Patient states he has been told he may have Hep C, but is unsure. He smokes 1 PPD Past Medical History  Diagnosis Date  . Hepatitis C   .  Depression   . Anxiety   . Substance abuse    Loss of Consciousness:  Denies Seizure History:  Denies Cardiac History:   denies Allergies:   Allergies  Allergen Reactions  . Wellbutrin [Bupropion] Hives   PTA Medications: No prescriptions prior to admission    Previous Psychotropic Medications:  Medication/Dose  Remembers Paxil, Celexa, Buspar,  Effexor XR in the past. Wellbutrin XL caused allergic reaction. States " I don't think I gave any of these medications time enough for them to work" Was not taking any prescribed medication prior to admission               Substance Abuse History in the last 12 months:  Yes.   IV opiates daily. Xanax frequently ( 1-2 times a week), occasional alcohol binges. Remote history of cannabis dependence but no recent use. One prior Rehab a few years ago.   Consequences of Substance Abuse: Medical Consequences:  poossible Hep C Legal Consequences:  Prior arrests for drug related charges Blackouts:    Social History:  reports that he has been smoking Cigarettes.  He has been smoking about 1.00 pack per day. He does not have any smokeless tobacco history on file. He reports that he drinks about 3.6 oz of alcohol per week. He reports that he uses illicit drugs (Marijuana, Cocaine, and Methamphetamines). Additional Social History:  Current Place of Residence:  Homeless, stays with " friends" Place of Birth:   Family Members: Marital Status:  Single Children: None   Sons:  Daughters: Relationships: States he is estranged from mother because she disapproves of his sexual orientation. He is close to father.  Education:  HS Graduate Educational Problems/Performance: Religious Beliefs/Practices: History of Abuse (Emotional/Phsycial/Sexual) Occupational Experiences; at this time not Charity fundraiser History:  None. Legal History: Prior drug related charges, but does not endorse any current legal issues Hobbies/Interests:  Family History:   (+) history of drug abuse in family ( father, aunt). One uncle committed suicide . Family History  Problem Relation  Age of Onset  . Anxiety disorder Father   . Drug abuse Father   . Anxiety disorder Paternal Grandmother   . Bipolar disorder Paternal Aunt   . Alcohol abuse Paternal Aunt   . Anxiety disorder Paternal Uncle     Results for orders placed or performed during the hospital encounter of 03/08/14 (from the past 72 hour(s))  Acetaminophen level     Status: None   Collection Time: 03/08/14  2:24 PM  Result Value Ref Range   Acetaminophen (Tylenol), Serum <15.0 10 - 30 ug/mL    Comment:        THERAPEUTIC CONCENTRATIONS VARY SIGNIFICANTLY. A RANGE OF 10-30 ug/mL MAY BE AN EFFECTIVE CONCENTRATION FOR MANY PATIENTS. HOWEVER, SOME ARE BEST TREATED AT CONCENTRATIONS OUTSIDE THIS RANGE. ACETAMINOPHEN CONCENTRATIONS >150 ug/mL AT 4 HOURS AFTER INGESTION AND >50 ug/mL AT 12 HOURS AFTER INGESTION ARE OFTEN ASSOCIATED WITH TOXIC REACTIONS.   CBC     Status: None   Collection Time: 03/08/14  2:24 PM  Result Value Ref Range   WBC 9.5 4.0 - 10.5 K/uL   RBC 4.96 4.22 - 5.81 MIL/uL   Hemoglobin 15.3 13.0 - 17.0 g/dL   HCT 44.1 39.0 - 52.0 %   MCV 88.9 78.0 - 100.0 fL   MCH 30.8 26.0 - 34.0 pg   MCHC 34.7 30.0 - 36.0 g/dL   RDW 13.2 11.5 - 15.5 %   Platelets 319 150 - 400 K/uL  Comprehensive  metabolic panel     Status: Abnormal   Collection Time: 03/08/14  2:24 PM  Result Value Ref Range   Sodium 136 (L) 137 - 147 mEq/L   Potassium 3.8 3.7 - 5.3 mEq/L   Chloride 97 96 - 112 mEq/L   CO2 27 19 - 32 mEq/L   Glucose, Bld 92 70 - 99 mg/dL   BUN 14 6 - 23 mg/dL   Creatinine, Ser 0.85 0.50 - 1.35 mg/dL   Calcium 9.5 8.4 - 10.5 mg/dL   Total Protein 7.8 6.0 - 8.3 g/dL   Albumin 4.1 3.5 - 5.2 g/dL   AST 41 (H) 0 - 37 U/L    Comment: SLIGHT HEMOLYSIS HEMOLYSIS AT THIS LEVEL MAY AFFECT RESULT    ALT 50 0 - 53 U/L   Alkaline Phosphatase 86 39 - 117 U/L   Total Bilirubin 1.0 0.3 - 1.2 mg/dL   GFR calc non Af Amer >90 >90 mL/min   GFR calc Af Amer >90 >90 mL/min    Comment: (NOTE) The eGFR  has been calculated using the CKD EPI equation. This calculation has not been validated in all clinical situations. eGFR's persistently <90 mL/min signify possible Chronic Kidney Disease.    Anion gap 12 5 - 15  Ethanol (ETOH)     Status: None   Collection Time: 03/08/14  2:24 PM  Result Value Ref Range   Alcohol, Ethyl (B) <11 0 - 11 mg/dL    Comment:        LOWEST DETECTABLE LIMIT FOR SERUM ALCOHOL IS 11 mg/dL FOR MEDICAL PURPOSES ONLY   Salicylate level     Status: Abnormal   Collection Time: 03/08/14  2:24 PM  Result Value Ref Range   Salicylate Lvl <7.7 (L) 2.8 - 20.0 mg/dL  Urine Drug Screen     Status: Abnormal   Collection Time: 03/08/14  2:26 PM  Result Value Ref Range   Opiates POSITIVE (A) NONE DETECTED   Cocaine POSITIVE (A) NONE DETECTED   Benzodiazepines NONE DETECTED NONE DETECTED   Amphetamines NONE DETECTED NONE DETECTED   Tetrahydrocannabinol NONE DETECTED NONE DETECTED   Barbiturates NONE DETECTED NONE DETECTED    Comment:        DRUG SCREEN FOR MEDICAL PURPOSES ONLY.  IF CONFIRMATION IS NEEDED FOR ANY PURPOSE, NOTIFY LAB WITHIN 5 DAYS.        LOWEST DETECTABLE LIMITS FOR URINE DRUG SCREEN Drug Class       Cutoff (ng/mL) Amphetamine      1000 Barbiturate      200 Benzodiazepine   412 Tricyclics       878 Opiates          300 Cocaine          300 THC              50    Psychological Evaluations:  Assessment:   23 year old single male, who reports a long history of substance dependence. Opiates are opiate of choice and has been using heroin and opiate analgesics IV up to two to three days ago. To a lesser degree was also using Benzodiazepines and Alcohol, although more irregularly. BAL negative upon admission and UDS negative for BZDs upon admission. In the context of drug abuse was feeling increasingly depressed and hopeless and recently had developed suicidal ideations . He continues to report depression and anxiety but  At this time denies any  suicidal ideations and contracts for safety on the unit. He does  endorse some depression and anxiety, particularly panic/agoraphobia. At this time he is presenting with some opiate withdrawal symptoms , mainly nausea, aches, myalgias, lacrimation.   DSM5:   AXIS I:  Opiate Dependence, Opiate Withdrawal, Opiate induced Mood Disorder, Depressed, Alcohol Abuse, Benzodiazepine Abuse, consider MDD. Consider Panic Disorder versus Substance Induced Anxiety Disorder AXIS II:  Deferred AXIS III:   Past Medical History  Diagnosis Date  . Hepatitis C   . Depression   . Anxiety   . Substance abuse    AXIS IV:  Homelessness, poor relationship with mother, limited support system, history of legal issues AXIS V:  41-50 serious symptoms  Treatment Plan/Recommendations:  See below  Treatment Plan Summary: Daily contact with patient to assess and evaluate symptoms and progress in treatment Medication management See below Current Medications:  Current Facility-Administered Medications  Medication Dose Route Frequency Provider Last Rate Last Dose  . alum & mag hydroxide-simeth (MAALOX/MYLANTA) 200-200-20 MG/5ML suspension 30 mL  30 mL Oral PRN Shuvon Rankin, NP      . busPIRone (BUSPAR) tablet 5 mg  5 mg Oral BID Shuvon Rankin, NP   5 mg at 03/10/14 0853  . citalopram (CELEXA) tablet 20 mg  20 mg Oral Daily Shuvon Rankin, NP   20 mg at 03/10/14 0853  . cloNIDine (CATAPRES) tablet 0.1 mg  0.1 mg Oral QID Shuvon Rankin, NP   0.1 mg at 03/10/14 0852   Followed by  . [START ON 03/11/2014] cloNIDine (CATAPRES) tablet 0.1 mg  0.1 mg Oral BH-qamhs Shuvon Rankin, NP       Followed by  . [START ON 03/14/2014] cloNIDine (CATAPRES) tablet 0.1 mg  0.1 mg Oral QAC breakfast Shuvon Rankin, NP      . dicyclomine (BENTYL) tablet 20 mg  20 mg Oral Q6H PRN Shuvon Rankin, NP      . hydrOXYzine (ATARAX/VISTARIL) tablet 50 mg  50 mg Oral Q6H PRN Shuvon Rankin, NP   50 mg at 03/09/14 2124  . Influenza vac split  quadrivalent PF (FLUARIX) injection 0.5 mL  0.5 mL Intramuscular Tomorrow-1000 Davina Howlett A Berlynn Warsame, MD      . loperamide (IMODIUM) capsule 2-4 mg  2-4 mg Oral PRN Shuvon Rankin, NP      . magnesium hydroxide (MILK OF MAGNESIA) suspension 30 mL  30 mL Oral Daily PRN Shuvon Rankin, NP      . methocarbamol (ROBAXIN) tablet 500 mg  500 mg Oral Q8H PRN Shuvon Rankin, NP      . naproxen (NAPROSYN) tablet 500 mg  500 mg Oral BID PRN Shuvon Rankin, NP      . ondansetron (ZOFRAN) tablet 4 mg  4 mg Oral Q8H PRN Shuvon Rankin, NP      . pneumococcal 23 valent vaccine (PNU-IMMUNE) injection 0.5 mL  0.5 mL Intramuscular Tomorrow-1000 Myer Peer Fleetwood Pierron, MD      . traZODone (DESYREL) tablet 50 mg  50 mg Oral QHS PRN Shuvon Rankin, NP   50 mg at 03/09/14 2124    Observation Level/Precautions:  15 minute checks  Laboratory:  patient is interested in checkinhg Hep B, C, HIV status  Psychotherapy:   Supportive, milieu  Medications:  Patient has been restarted on Celexa and Buspar- had been on these meds in the past but not recently. Does not remember having had any side effects. Will  Monitor. Also on Clonidine taper to address opiate withdrawal.  Consultations: If needed    Discharge Concerns:  Limited support network  Estimated LOS: 6 days  Other:     I certify that inpatient services furnished can reasonably be expected to improve the patient's condition.   Carly Applegate 11/30/201511:39 AM

## 2014-03-10 NOTE — Progress Notes (Signed)
Patient ID: [name] HanMichael Cody, male   DOB: 05/07/1990, 23 y.o.   MRN: [id number]  Patient denied SI and HI.  Denied A/V hallucinations.  Rated depression and hopeless 7, anxiety 10.  Goal today is to look into after care.  Plan to meet with MD/CW.

## 2014-03-10 NOTE — Progress Notes (Signed)
Adult Psychoeducational Group Note  Date:  03/10/2014 Time:  9:29 PM  Group Topic/Focus:  Wrap-Up Group:   The focus of this group is to help patients review their daily goal of treatment and discuss progress on daily workbooks.  Participation Level:  Active  Participation Quality:  Attentive  Affect:  Appropriate  Cognitive:  Appropriate  Insight: Good  Engagement in Group:  Engaged  Modes of Intervention:  Discussion  Additional Comments:  Pt accomplish [facility]  [place] Fischel H 03/10/2014, 9:29 PM

## 2014-03-10 NOTE — Plan of Care (Signed)
Problem: Alteration in mood Goal: STG-Patient reports thoughts of self-harm to staff Outcome: Progressing Pt reports been suicidal before admission to ED but denies during this admission

## 2014-03-10 NOTE — [facility] Suicide Risk Assessment (Signed)
   Nursing information obtained from:  Patient, Review of record Demographic factors:  Male, Caucasian, Low socioeconomic status, Unemployed Current Mental Status:  Suicidal ideation indicated by patient, Plan includes specific time, place, or method Loss Factors:  Financial problems / change in socioeconomic status Historical Factors:  Prior suicide attempts, Family history of suicide, Family history of mental illness or substance abuse Risk Reduction Factors:  Living with another person, especially a relative, Positive social support Total Time spent with patient: 45 minutes  CLINICAL FACTORS:   Opiate Dependence, Depression  Psychiatric Specialty Exam: Physical Exam  ROS  Blood pressure 127/75, pulse 115, temperature 98.2 F (36.8 C), temperature source Oral, resp. rate 16, height 5\' 11"  (1.803 m), weight 73.936 kg (163 lb), SpO2 99 %.Body mass index is 22.74 kg/(m^2).  SEE ADMIT NOTE MSE   COGNITIVE FEATURES THAT CONTRIBUTE TO RISK:  Closed-mindedness    SUICIDE RISK:   Moderate:  Frequent suicidal ideation with limited intensity, and duration, some specificity in terms of plans, no associated intent, good self-control, limited dysphoria/symptomatology, some risk factors present, and identifiable protective factors, including available and accessible social support.  PLAN OF CARE: Patient will be admitted to inpatient psychiatric unit for stabilization and safety. Will provide and encourage milieu participation. Provide medication management and maked adjustments as needed. Will also provide medication management to minimize risk of wothdrawal.  Will follow daily.    I certify that inpatient services furnished can reasonably be expected to improve the patient's condition.  Skye Rodarte 03/10/2014, 12:10 PM

## 2014-03-10 NOTE — Progress Notes (Signed)
Psychoeducational Group Note  Date:  03/10/2014 Time:  0422  Group Topic/Focus:  Wrap-Up Group:   The focus of this group is to help patients review their daily goal of treatment and discuss progress on daily workbooks.  Participation Level: Did Not Attend  Participation Quality:  Not Applicable  Affect:  Not Applicable  Cognitive:  Not Applicable  Insight:  Not Applicable  Engagement in Group: Not Applicable  Additional Comments:  The patient did not attend group this evening.   Jazalynn Mireles S 03/10/2014, 4:22 AM

## 2014-03-10 NOTE — [facility] Group Notes (Signed)
[facility] LCSW Aftercare Discharge Planning Group Note  03/10/2014  8:45 AM  Participation Quality: Did Not Attend- patient in bed.  Natascha Edmonds, MSW, LCSWA Clinical Social Worker [facility] [phone]   

## 2014-03-11 LAB — HEPATITIS C ANTIBODY: HCV Ab: REACTIVE — AB

## 2014-03-11 LAB — HIV ANTIBODY (ROUTINE TESTING W REFLEX): HIV 1&2 Ab, 4th Generation: NONREACTIVE

## 2014-03-11 LAB — HEPATITIS B SURFACE ANTIGEN: Hepatitis B Surface Ag: NEGATIVE

## 2014-03-11 NOTE — [facility] Suicide Risk Assessment (Signed)
[facility] INPATIENT:  Family/Significant Other Suicide Prevention Education  Suicide Prevention Education:  Patient Refusal for Family/Significant Other Suicide Prevention Education: The patient [name] HanMichael Cody has refused to provide written consent for family/significant other to be provided Family/Significant Other Suicide Prevention Education during admission and/or prior to discharge.  Physician notified. SPE reviewed with patient, brochure provided.  Violett Hobbs, [place] CarboKristin L 03/11/2014, 8:27 AM

## 2014-03-11 NOTE — Clinical Social Work Note (Signed)
CSW met with patient to discuss discharge plans.  He advised of being interested in residential treatment at [facility].  Referral made as requested by patient.

## 2014-03-11 NOTE — Progress Notes (Signed)
Pt attended spiritual care group on grief and loss facilitated by counseling intern [place] and chaplain Burnis KingfisherMatthew Layna Roeper. Group opened with brief discussion and psycho-social ed around grief and loss in relationships and in relation to self - identifying life patterns, circumstances, changes that cause losses. Established group norm of speaking from own life experience. Group goal of establishing open and affirming space for members to share loss and experience with grief, normalize grief experience and provide psycho social education and grief support.  Casimiro NeedleMichael was present at the beginning of group, but left the room shortly after group began and did not return. Casimiro NeedleMichael behaved appropriately while in the room.   [place] Austin Counseling Intern

## 2014-03-11 NOTE — Tx Team (Signed)
Interdisciplinary Treatment Plan Update   Date Reviewed:  03/11/2014  Time Reviewed:  8:55 AM  Progress in Treatment:   Attending groups: Yes Participating in groups: Yes Taking medication as prescribed: Yes  Tolerating medication: Yes Family/Significant other contact made:  No, but will ask patient for consent for collateral contact Patient understands diagnosis: Yes  Discussing patient identified problems/goals with staff: Yes Medical problems stabilized or resolved: Yes Denies suicidal/homicidal ideation: Yes Patient has not harmed self or others: Yes  For review of initial/current patient goals, please see plan of care.  Estimated Length of Stay:    Reasons for Continued Hospitalization:  Anxiety Depression Medication stabilization Suicidal ideation  New Problems/Goals identified:    Discharge Plan or Barriers:   Home with outpatient follow up to be determined  Additional Comments:    23 year old male. Reports long history of substance dependence, primarily opiate dependence. Has been using IV heroin and opiate analgesics, daily, up to one gram of heroin per day. To a lesser degree, he has also been using xanax intermittently, and has been drinking in binges, once or twice a week. He recently " was using drugs with an aunt, and I felt hopeless, and became suicidal". He did not attempt suicide.At that point his father encouraged him to seek inpatient psychiatric care. At this time he endorses symptoms of opiate withdrawal- these include nausea, some vomiting , sweating, cramps, aches, yawning, lacrimation.  He remains depressed and anxious but currently denies any suicidal ideations.  Patient and CSW reviewed patient's identified goals and treatment plan.  Patient verbalized understanding and agreed to treatment plan.   Attendees:  Patient:  03/11/2014 8:55 AM   Signature:  Sallyanne HaversF. Cobos, MD 03/11/2014 8:55 AM  Signature: Geoffery LyonsIrving Lugo, MD 03/11/2014 8:55 AM  Signature: Kathi SimpersSarah  Twyman, RN 03/11/2014 8:55 AM  Signature: [place] Minersonna Shimp, RN 03/11/2014 8:55 AM  Signature:   03/11/2014 8:55 AM  Signature:  Juline PatchQuylle Kaytlin Burklow, LCSW 03/11/2014 8:55 AM  Signature:  Belenda CruiseKristin Drinkard, LCSW-A 03/11/2014 8:55 AM  Signature:  Leisa LenzValerie Enoch, Care Coordinator [facility] 03/11/2014 8:55 AM  Signature:   03/11/2014 8:55 AM  Signature: Michaelle BirksBritney Guthrie, RN 03/11/2014  8:55 AM  Signature:   Onnie BoerJennifer Clark, RN [facility] 03/11/2014  8:55 AM  Signature:   03/11/2014  8:55 AM    Scribe for Treatment Team:   Juline PatchQuylle Jarron Curley,  03/11/2014 8:55 AM

## 2014-03-11 NOTE — [facility] Group Notes (Signed)
[facility] Group Notes:  (Nursing/MHT/Case Management/Adjunct)  Date:  03/11/2014  Time:  0845am  Type of Therapy:  Psychoeducational Skills  Participation Level:  Did Not Attend  Participation Quality:  Did not attend  Affect:  Did not attend  Cognitive:  Did not attend  Insight:  None  Engagement in Group:  None  Modes of Intervention:  Education and Support  Summary of Progress/Problems: Pt did not attend group and remained in room. Pt reports uncontrolled "anxiety."  Laurine BlazerGuthrie, [place]ittany A 03/11/2014, 2:12 PM

## 2014-03-11 NOTE — Progress Notes (Signed)
Patient ID: [name], male   DOB: 07/18/1990, 23 y.o.   MRN: [id number] D: Patient alert and cooperative. 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 Pt mood/affect is anxious and sad. Pt denies SI/HI/AVH. No acute distressed noted at this time.   A: Medications administered as prescribed. Emotional support given and will continue to monitor pt's progress for stabilization.  R: Patient remains safe and complaint with medications.

## 2014-03-11 NOTE — Progress Notes (Signed)
[facility]  03/11/2014 11:14 AM [name]  MRN:  [id number] Subjective:  Continues to report symptoms of opiate withdrawal- these include " retching", diarrhea, some aches/pains, and feeling " hot and cold/chilly". He does state he is feeling better than yesterday, however. He reports some ongoing anxiety/depression as well. Objective:  I have discussed case with treatment team. Patient reporting ongoing opiate withdrawal symptoms as above, but seems calm and does not appear distressed or restless currently. Patient is going to groups , and has been visible on the unit, interacting with selected peers. Remains depressed and dysphoric/anxious, but improved compared to admission. Denies medication side effects.  Does describe some cravings, but states he feels strong about his recovery efforts. Encouraged him to consider going to an inpatient rehab after discharge if possible.   Diagnosis:  Opiate Dependence, Opiate Withdrawal, Opiate induced Mood Disorder, Depressed, Alcohol Abuse, Benzodiazepine Abuse, consider MDD. Consider Panic Disorder versus Substance Induced Anxiety Disorder  Total Time spent with patient: 25 minutes     ADL's:  Improving   Sleep:improving  Appetite:fair  Suicidal Ideation:  Denies any current suicidal ideations Homicidal Ideation:  Denies any current homicidal ideations AEB (as evidenced by):  Psychiatric Specialty Exam: Physical Exam  ROS  Blood pressure 114/78, pulse 116, temperature 98 F (36.7 C), temperature source Oral, resp. rate 18, height 5\' 11"  (1.803 m), weight 73.936 kg (163 lb), SpO2 99 %.Body mass index is 22.74 kg/(m^2).  General Appearance: Fairly Groomed  Patent attorneyye Contact::  Good  Speech:  Clear and Coherent  Volume:  Normal  Mood:  Anxious and Depressed- but improved compared to admission  Affect:  anxious, but reactive  Thought Process:  Intact and Linear  Orientation:  Full (Time, Place, and Person)  Thought Content:   denies hallucinations , no delusions  Suicidal Thoughts:  No At this time denies any suicidal plan or intent and contracts for safety on the unit  Homicidal Thoughts:  No  Memory: Recent and Remote grossly intact  Judgement:  Fair  Insight:  Fair  Psychomotor Activity:  Normal  Concentration:  Good  Recall:  Good  Fund of Knowledge:Good  Language: Good  Akathisia:  Negative  Handed:  Right  AIMS (if indicated):     Assets:  Desire for Improvement Resilience  Sleep:  Number of Hours: 5.75   Musculoskeletal: Strength & Muscle Tone: within normal limits no significant distal tremors or diaphoresis noted  Gait & Station: normal Patient leans: N/A  Current Medications: Current Facility-Administered Medications  Medication Dose Route Frequency Provider Last Rate Last Dose  . alum & mag hydroxide-simeth (MAALOX/MYLANTA) 200-200-20 MG/5ML suspension 30 mL  30 mL Oral PRN Shuvon Rankin, NP      . busPIRone (BUSPAR) tablet 5 mg  5 mg Oral BID Shuvon Rankin, NP   5 mg at 03/11/14 0812  . chlordiazePOXIDE (LIBRIUM) capsule 25 mg  25 mg Oral QID PRN Craige CottaFernando A Emmaclaire Switala, MD   25 mg at 03/10/14 2006  . citalopram (CELEXA) tablet 20 mg  20 mg Oral Daily Shuvon Rankin, NP   20 mg at 03/11/14 40980812  . cloNIDine (CATAPRES) tablet 0.1 mg  0.1 mg Oral QID Shuvon Rankin, NP   0.1 mg at 03/11/14 11910812   Followed by  . cloNIDine (CATAPRES) tablet 0.1 mg  0.1 mg Oral BH-qamhs Shuvon Rankin, NP       Followed by  . [START ON 03/14/2014] cloNIDine (CATAPRES) tablet 0.1 mg  0.1 mg Oral QAC breakfast Shuvon  Rankin, NP      . dicyclomine (BENTYL) tablet 20 mg  20 mg Oral Q6H PRN Shuvon Rankin, NP      . hydrOXYzine (ATARAX/VISTARIL) tablet 50 mg  50 mg Oral Q6H PRN Shuvon Rankin, NP   50 mg at 03/11/14 1023  . loperamide (IMODIUM) capsule 2-4 mg  2-4 mg Oral PRN Shuvon Rankin, NP      . magnesium hydroxide (MILK OF MAGNESIA) suspension 30 mL  30 mL Oral Daily PRN Shuvon Rankin, NP      . methocarbamol (ROBAXIN)  tablet 500 mg  500 mg Oral Q8H PRN Shuvon Rankin, NP      . naproxen (NAPROSYN) tablet 500 mg  500 mg Oral BID PRN Shuvon Rankin, NP      . ondansetron (ZOFRAN) tablet 4 mg  4 mg Oral Q8H PRN Shuvon Rankin, NP      . traZODone (DESYREL) tablet 50 mg  50 mg Oral QHS PRN Shuvon Rankin, NP   50 mg at 03/10/14 2243    Lab Results: No results found for this or any previous visit (from the past 48 hour(s)).  Physical Findings: AIMS: Facial and Oral Movements Muscles of Facial Expression: None, normal Lips and Perioral Area: None, normal Jaw: None, normal Tongue: None, normal,Extremity Movements Upper (arms, wrists, hands, fingers): None, normal Lower (legs, knees, ankles, toes): None, normal, Trunk Movements Neck, shoulders, hips: None, normal, Overall Severity Severity of abnormal movements (highest score from questions above): None, normal Incapacitation due to abnormal movements: None, normal Patient's awareness of abnormal movements (rate only patient's report): No Awareness, Dental Status Current problems with teeth and/or dentures?: No Does patient usually wear dentures?: No  CIWA:  CIWA-Ar Total: 5 COWS:  COWS Total Score: 6   Assessment: Patient remains depressed, anxious, but better than upon admission. He does continue to report some symptoms of opiate withdrawal, slowly improving. He remains tachycardic.  At this time denies any SI.  He is visible on unit, and going to groups.   Treatment Plan Summary: Daily contact with patient to assess and evaluate symptoms and progress in treatment Medication management See below  Plan: Continue inpatient treatment and support  Continue Clonidine taper/detox protocol Continue  Celexa 20 mgrs QDAY Continue Buspar  5 mgrs BID  Medical Decision Making Problem Points:  Established problem, stable/improving (1), Review of last therapy session (1) and Review of psycho-social stressors (1) Data Points:  Review or order clinical lab tests  (1) Review of medication regiment & side effects (2)  I certify that inpatient services furnished can reasonably be expected to improve the patient's condition.   Ellaree Gear 03/11/2014, 11:14 AM

## 2014-03-11 NOTE — Progress Notes (Signed)
Recreation Therapy Notes  Animal-Assisted Activity/Therapy (AAA/T) Program Checklist/Progress Notes Patient Eligibility Criteria Checklist & Daily Group note for Rec Tx Intervention  Date: 12.01.2015 Time: 2:45pm Location: [address] Dayroom    AAA/T Program Assumption of Risk Form signed by Patient/ or Parent Legal Guardian yes  Patient is free of allergies or sever asthma yes  Patient reports no fear of animals yes  Patient reports no history of cruelty to animals yes   Patient understands his/her participation is voluntary yes  Patient washes hands before animal contact yes  Patient washes hands after animal contact yes  Behavioral Response: Did not attend.   [name] L Eugean Arnott, LRT/CTRS        Darianne Muralles L 03/11/2014 5:04 PM 

## 2014-03-11 NOTE — Plan of Care (Signed)
Problem: Alteration in mood & ability to function due to Goal: LTG-Pt reports reduction in suicidal thoughts (Patient reports reduction in suicidal thoughts and is able to verbalize a safety plan for whenever patient is feeling suicidal)  Outcome: Progressing Patient denies suicidal thoughts today. Goal: LTG-Patient demonstrates decreased signs of withdrawal 11/30: Goal not met: Pt continues to have withdrawal symptoms of anxiety, restlessness, pupil size, runny nose, and aches and a COWS score of a 9. Pt to show decrease withdrawal symptoms prior to d/c.  Tilden Fossa, MSW, [place] [facility] [phone] 03/10/2014 5:12 PM    (Patient demonstrates decreased signs of withdrawal to the point the patient is safe to return home and continue treatment in an outpatient setting)  Outcome: Not Progressing Patient continues to experience withdrawal symptoms including cold chills, diarrhea, and pupils are noted to be large. Goal: STG-Patient will attend groups Outcome: Not Progressing Patient is attempting to attend group but not able to stay and states his anxiety is uncontrollable.Marland Kitchen  "I can't get it under control, I had to leave group."  Problem: Ineffective individual coping Goal: STG: Patient will remain free from self harm Outcome: Progressing Patient remains free from self harm and 15 minute checks completed per protocol for pt safety.

## 2014-03-11 NOTE — Progress Notes (Signed)
Patient ID: [name], male   DOB: 12/22/1990, 23 y.o.   MRN: [id number] D: Patient alert and cooperative. Pt c/o tremors and anxiety. Pt reports feeling helpless about his anxiety and unable to go into crowded areas. Pt mood/affect is anxious and sad. Pt denies SI/HI/AVH. No acute distressed noted at this time.   A: Medications administered as prescribed. Pt encourage to engage in group activities and avoid been in his room for long periods. Emotional support given and will continue to monitor pt's progress for stabilization.  R: Patient remains safe and complaint with medications.

## 2014-03-11 NOTE — Progress Notes (Signed)
1600 scheduled COWS assessment not completed d/t pt sleeping.

## 2014-03-11 NOTE — Progress Notes (Signed)
D: Patient is alert and oriented. Pt's mood and affect is "up and down" and anxious. Pt reports depression 6/10, hopelessness 7/10, and anxiety 9/10. Pt denies SI/HI and AVH at this time. Pt reports his goal for the day is to "stablize meds and speak with doctor." Pt reports he had to leave group because he "cannot get my anxiety under control." Pt reports some anxiety relief with PRN medication. Pt reports withdrawal symptoms- "some diarrhea and cold chills." Pt not attending groups and remains in bed the majority of the afternoon.  A: Support/Encouragement provided to pt. PRN medications administered for anxiety per providers orders (See MAR). Scheduled medications administered per providers orders (See MAR). 15 minute checks completed per protocol for pt safety. R: Pt cooperative and receptive to nursing interventions.

## 2014-03-11 NOTE — Progress Notes (Signed)
Adult Psychoeducational Group Note  Date:  03/11/2014 Time:  10:39 PM  Group Topic/Focus:  Wrap-Up Group:   The focus of this group is to help patients review their daily goal of treatment and discuss progress on daily workbooks.  Participation Level:  Did Not Attend   Additional Comments:  Pt was communicating with his Nurse at the time of group.   Casilda CarlsKELLY, Khylei Wilms H 03/11/2014, 10:39 PM
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
## 2014-03-11 NOTE — [facility] Group Notes (Signed)
[facility] LCSW Group Therapy      Feelings About Diagnosis 1:15 - 2:30 PM         03/11/2014 2:56 PM    Type of Therapy:  Group Therapy  Participation Level:  Active  Participation Quality:  Appropriate  Affect:  Appropriate  Cognitive:  Alert and Appropriate  Insight:  Developing/Improving and Engaged  Engagement in Therapy:  Developing/Improving and Engaged  Modes of Intervention:  Discussion, Education, Exploration, Problem-Solving, Rapport Building, Support  Summary of Progress/Problems:  Patient actively participated in group. Patient discussed past and present diagnosis and the effects it has had on  life.  Patient talked about family and society being judgmental and the stigma associated with having a mental health diagnosis.  Patient shared he is embarrassed because of his diagnosis.  He talked about how people tend to look at him differently.  Wynn BankerHodnett, Takeya Marquis Hairston 03/11/2014  2:56 PM

## 2014-03-12 DIAGNOSIS — F1124 Opioid dependence with opioid-induced mood disorder: Secondary | ICD-10-CM | POA: Insufficient documentation

## 2014-03-12 DIAGNOSIS — F1121 Opioid dependence, in remission: Secondary | ICD-10-CM

## 2014-03-12 LAB — HCV RNA QUANT
HCV Quantitative Log: 6.66 {Log} — ABNORMAL HIGH (ref <1.18)
HCV Quantitative: 4557947 IU/mL — ABNORMAL HIGH (ref <15)

## 2014-03-12 MED ORDER — TRAZODONE HCL 50 MG PO TABS: 50.0000 mg | ORAL_TABLET | Freq: Every evening | ORAL | Status: AC | PRN

## 2014-03-12 MED ORDER — BUSPIRONE HCL 5 MG PO TABS: 5.0000 mg | ORAL_TABLET | Freq: Two times a day (BID) | ORAL | Status: AC

## 2014-03-12 MED ORDER — CITALOPRAM HYDROBROMIDE 20 MG PO TABS: 20.0000 mg | ORAL_TABLET | Freq: Every day | ORAL | Status: AC

## 2014-03-12 NOTE — Plan of Care (Signed)
Problem: Alteration in mood & ability to function due to Goal: LTG-Patient demonstrates decreased signs of withdrawal 11/30: Goal not met: Pt continues to have withdrawal symptoms of anxiety, restlessness, pupil size, runny nose, and aches and a COWS score of a 9. Pt to show decrease withdrawal symptoms prior to d/c.  Tilden Fossa, MSW, [facility] [phone] 03/10/2014 5:12 PM  Goal not met. Patient has a CIWA of two. CIWA will be at zero prior to discharge.  Burnis Medin Prajna Vanderpool, LCSW 03/12/2014 11:34 AM       (Patient demonstrates decreased signs of withdrawal to the point the patient is safe to return home and continue treatment in an outpatient setting)  Outcome: Adequate for Discharge

## 2014-03-12 NOTE — [facility] Group Notes (Signed)
[facility] LCSW Group Therapy  Emotional Regulation 1:15 - 2: 30 PM        03/12/2014     Type of Therapy:  Group Therapy  Participation Level:  Appropriate  Participation Quality:  Appropriate  Affect:  Appropriate  Cognitive:  Attentive Appropriate  Insight:  Developing/Improving Engaged  Engagement in Therapy:  Developing/Improving Engaged  Modes of Intervention:  Discussion Exploration Problem-Solving Supportive  Summary of Progress/Problems:  Group topic was emotional regulations.  Patient participated in the discussion and was able to identify an emotion that needed to regulated. Patient shared the emotion he has to control is social anxiety.  He talked about how difficult it is to attend a family function or how many things in life he misses out on.  Patient was able to identify approprite coping skills including pushing himself to go wherever he needs to go.  [name], [name] 03/12/2014

## 2014-03-12 NOTE — [facility] Suicide Risk Assessment (Addendum)
Demographic Factors:  23 year old caucasian male, single  Total Time spent with patient: 30 minutes  Psychiatric Specialty Exam: Physical Exam  ROS  Blood pressure 116/82, pulse 98, temperature 97.9 F (36.6 C), temperature source Oral, resp. rate 20, height 5\' 11"  (1.803 m), weight 73.936 kg (163 lb), SpO2 99 %.Body mass index is 22.74 kg/(m^2).  General Appearance: improved grooming  Eye Contact::  Good  Speech:  Normal Rate  Volume:  Normal  Mood:  improved and at this time does not endorse significat depression  Affect:  Appropriate  Thought Process:  Goal Directed and Linear  Orientation:  Full (Time, Place, and Person)  Thought Content:  no hallucinations, no delusions  Suicidal Thoughts:  No- denies any current thoughts of hurting self or anyone else   Homicidal Thoughts:  No  Memory:  recent and remote grossly intact   Judgement:  Fair  Insight:  improved  Psychomotor Activity:  Normal- appears calm and does not appear to be in any acute distress  Concentration:  Good  Recall:  Good  Fund of Knowledge:Good  Language: Good  Akathisia:  Negative  Handed:  Right  AIMS (if indicated):     Assets:  Desire for Improvement Resilience Social Support  Sleep:  Number of Hours: 6.5    Musculoskeletal: Strength & Muscle Tone: within normal limits Gait & Station: normal Patient leans: N/A   Mental Status Per Nursing Assessment::   On Admission:  Suicidal ideation indicated by patient, Plan includes specific time, place, or method  Current Mental Status by Physician: As noted above, at this time patient presents with improved mood, improved range of affect, denies any SI or HI, and has no current psychotic symptoms. He is future oriented. Behavior on unit calm and in good control. Symptoms of opiate withdrawal have abated, and he is currently calm and in no acute distress or discomfort. Vitals are stable.   Loss Factors: Unemployment, strained relationship with  mother  Historical Factors: History of Opiate Dependence- denies any history of suicidal attempts  Risk Reduction Factors:   Sense of responsibility to family and Living with another person, especially a relative  Continued Clinical Symptoms:  At this time mood and affect improved, improved range of affect, no SI or HI, no severe/significant opiate withdrawal symptoms endorsed, vitals stable. Patient is future oriented and states he is wanting to go to  A Rehab setting once bed becomes available ( which as per Child psychotherapistocial Worker report, could be tomorrow or later this week). In the meantime, patient will be staying with father and states father will help him maintain sobriety.  Cognitive Features That Contribute To Risk:  No gross cognitive deficits noted upon discharge. Is alert , attentive, and oriented x 3   Suicide Risk:  Mild:  Suicidal ideation of limited frequency, intensity, duration, and specificity.  There are no identifiable plans, no associated intent, mild dysphoria and related symptoms, good self-control (both objective and subjective assessment), few other risk factors, and identifiable protective factors, including available and accessible social support.  Discharge Diagnoses:   AXIS I:  Opiate Dependence, Opiate Withdrawal, Opiate induced Mood Disorder, Depressed, Alcohol Abuse, Benzodiazepine Abuse, consider MDD. Consider Panic Disorder versus Substance Induced Anxiety Disorder AXIS II:  Deferred AXIS III:   Past Medical History  Diagnosis Date  . Hepatitis C   . Depression   . Anxiety   . Substance abuse    AXIS IV:  problems related to social environment AXIS V: 65-70  upon discharge  Plan Of Care/Follow-up recommendations:  Activity:  As tolerated Diet:  Regular Tests:  NA Other:  See below  Is patient on multiple antipsychotic therapies at discharge:  No   Has Patient had three or more failed trials of antipsychotic monotherapy by history:  No  Recommended  Plan for Multiple Antipsychotic Therapies: NA  Patient plans to stay with his father for the next couple of days until a bed becomes available at [facility]). We also discussed the importance of following up with PCP or Gastroenterologist regarding management of Hep C. Patient expressed understanding and agreed. *Patient aware of the potential for antidepressant management to cause suicidal ideations early in treatment in young adults- as noted patient denies any such thoughts currently. Follow up as below Follow up with [facility] On 03/13/2014.    Why: [facility] [phone]) on Thursday, March 13, 2014 regarding bed availabilty. Please call around [facility]8AM   Contact information:   [address] [place], [place] [address]  [phone]      [facility].   Why: Please go to [facility] walk in clinic on Wednesday, March 13, 2014(if not accepted [facility]) or any weekday between 8AM -3PM [facility] for medication management/counseling.    Contact information:   [address] [place], [place] [address]     Nehemiah MassedCOBOS, FERNANDO 03/12/2014, 1:17 PM

## 2014-03-12 NOTE — Plan of Care (Signed)
Problem: Diagnosis: Increased Risk For Suicide Attempt Goal: STG-Patient Will Comply With Medication Regime Outcome: Completed/Met Date Met:  03/12/14 Patient is compliant with medication regimen. Goal is met.

## 2014-03-12 NOTE — Plan of Care (Signed)
Problem: Alteration in mood Goal: LTG-Pt's behavior demonstrates decreased signs of depression 11/30: Goal not met: Pt presents with flat affect and depressed mood. Pt admitted with depression rating of 10. Pt to show decreased sign of depression and a rating of 3 or less before d/c.   Tilden Fossa, MSW, [facility] [phone]  Goal met. Patient is rating depression at two. Goal is for depression to be rated at three or below on discharge.  Burnis Medin Emoni Yang, LCSW 10:16 AM     (Patient's behavior demonstrates decreased signs of depression to the point the patient is safe to return home and continue treatment in an outpatient setting)  Outcome: Completed/Met Date Met:  03/12/14

## 2014-03-12 NOTE — Discharge Summary (Signed)
Physician Discharge Summary Note  Patient:  [name] is an 23 y.o., male MRN:  [id number] DOB:  Jan 01, 1991 Patient phone:  [phone] (home)  Patient address:   [address] [place] [address],  Total Time spent with patient: 30 minutes  Date of Admission:  03/09/2014 Date of Discharge: 03/12/14  Reason for Admission:  Depression, Substance abuse   Discharge Diagnoses: Active Problems:   MDD (major depressive disorder), recurrent severe, without psychosis   Opioid dependence with opioid-induced mood disorder  Psychiatric Specialty Exam: Physical Exam  Psychiatric: He has a normal mood and affect. His speech is normal and behavior is normal. Judgment and thought content normal. Cognition and memory are normal.    Review of Systems  Constitutional: Negative.   HENT: Negative.   Eyes: Negative.   Respiratory: Negative.   Cardiovascular: Negative.   Gastrointestinal: Negative.   Genitourinary: Negative.   Musculoskeletal: Negative.   Skin: Negative.   Neurological: Negative.   Endo/Heme/Allergies: Negative.   Psychiatric/Behavioral: Positive for depression (Stabilized with treatment).    Blood pressure 116/82, pulse 98, temperature 97.9 F (36.6 C), temperature source Oral, resp. rate 20, height 5\' 11"  (1.803 m), weight 73.936 kg (163 lb), SpO2 99 %.Body mass index is 22.74 kg/(m^2).  See Physician SRA     Past Psychiatric History: See H&P Diagnosis:  Hospitalizations:  Outpatient Care:  Substance Abuse Care:  Self-Mutilation:  Suicidal Attempts:  Violent Behaviors:   Musculoskeletal: Strength & Muscle Tone: within normal limits Gait & Station: normal Patient leans: N/A  DSM5:  AXIS I: Opiate Dependence, Opiate Withdrawal, Opiate induced Mood Disorder, Depressed, Alcohol Abuse, Benzodiazepine Abuse, consider MDD. Consider Panic Disorder versus Substance Induced Anxiety Disorder AXIS II: Deferred AXIS III:  Past Medical History  Diagnosis  Date  . Hepatitis C   . Depression   . Anxiety   . Substance abuse    AXIS IV: problems related to social environment AXIS V: 65-70 upon discharge  Level of Care:  OP  Hospital Course: [name] is a 23 year old male. Reports long history of substance dependence, primarily opiate dependence. Has been using IV heroin and opiate analgesics, daily, up to one gram of heroin per day. To a lesser degree, he has also been using xanax intermittently, and has been drinking in binges, once or twice a week. He recently " was using drugs with an aunt, and I felt hopeless, and became suicidal". He did not attempt suicide.At that point his father encouraged him to seek inpatient psychiatric care.At this time he endorses symptoms of opiate withdrawal- these include nausea, some vomiting , sweating, cramps, aches, yawning, lacrimation. He remains depressed and anxious but currently denies any suicidal ideations.         [name] was admitted to the adult unit. He was evaluated and his symptoms were identified. Medication management was discussed and initiated. He was not taking any prescription medications prior to admission. The patient was placed on the Clonidine protocol to address his multiple symptoms of opiate withdrawal. He was started on Buspar 5 mg BID to treat anxiety and Celexa 20 mg daily for depression.   He was oriented to the unit and encouraged to participate in unit programming. Medical problems were identified and treated appropriately. Home medication was restarted as needed.        The patient was evaluated each day by a clinical provider to ascertain the patient's response to treatment. He reported decreasing levels of depression and anxiety as his medications  were adjusted.  Improvement was noted by the patient's report of decreasing symptoms, improved sleep and appetite, affect, medication tolerance, behavior, and participation in unit programming.  He was asked each day  to complete a self inventory noting mood, mental status, pain, new symptoms, anxiety and concerns. The patient continued to report opiate withdrawal symptoms but did not appear distressed or restless on the unit.          He responded well to medication and being in a therapeutic and supportive environment. Positive and appropriate behavior was noted and the patient was motivated for recovery.  The patient worked closely with the treatment team and case manager to develop a discharge plan with appropriate goals. Coping skills, problem solving as well as relaxation therapies were also part of the unit programming.         By the day of discharge he was in much improved condition than upon admission.  Symptoms were reported as significantly decreased or resolved completely. The patient denied SI/HI and voiced no AVH. He was motivated to continue taking medication with a goal of continued improvement in mental health. [name] was discharged home with a plan to follow up as noted below. Patient was provided with prescriptions and sample medications. He left [facility] in no acute distress with all belongings returned to him.   Consults:  psychiatry  Significant Diagnostic Studies:  Chemistry panel, CBC, UDS positive for opiates and cocaine  Discharge Vitals:   Blood pressure 116/82, pulse 98, temperature 97.9 F (36.6 C), temperature source Oral, resp. rate 20, height 5\' 11"  (1.803 m), weight 73.936 kg (163 lb), SpO2 99 %. Body mass index is 22.74 kg/(m^2). Lab Results:   Results for orders placed or performed during the hospital encounter of 03/09/14 (from the past 72 hour(s))  HIV antibody     Status: None   Collection Time: 03/11/14  6:33 AM  Result Value Ref Range   HIV 1&2 Ab, 4th Generation NONREACTIVE NONREACTIVE    Comment: (NOTE) A NONREACTIVE HIV Ag/Ab result does not exclude HIV infection since the time frame for seroconversion is variable. If acute HIV infection is suspected, a HIV-1 RNA  Qualitative TMA test is recommended. HIV-1/2 Antibody Diff         Not indicated. HIV-1 RNA, Qual TMA           Not indicated. PLEASE NOTE: This information has been disclosed to you from records whose confidentiality may be protected by state law. If your state requires such protection, then the state law prohibits you from making any further disclosure of the information without the specific written consent of the person to whom it pertains, or as otherwise permitted by law. A general authorization for the release of medical or other information is NOT sufficient for this purpose. The performance of this assay has not been clinically validated in patients less than 23 years old. Performed at [facility]   Hepatitis C antibody     Status: Abnormal   Collection Time: 03/11/14  6:33 AM  Result Value Ref Range   HCV Ab Reactive (A) NEGATIVE    Comment: Performed at [facility]  Hepatitis B surface antigen     Status: None   Collection Time: 03/11/14  6:33 AM  Result Value Ref Range   Hepatitis B Surface Ag NEGATIVE NEGATIVE    Comment: Performed at [facility]    Physical Findings: AIMS: Facial and Oral Movements Muscles of Facial Expression: None, normal Lips  and Perioral Area: None, normal Jaw: None, normal Tongue: None, normal,Extremity Movements Upper (arms, wrists, hands, fingers): None, normal Lower (legs, knees, ankles, toes): None, normal, Trunk Movements Neck, shoulders, hips: None, normal, Overall Severity Severity of abnormal movements (highest score from questions above): None, normal Incapacitation due to abnormal movements: None, normal Patient's awareness of abnormal movements (rate only patient's report): No Awareness, Dental Status Current problems with teeth and/or dentures?: No Does patient usually wear dentures?: No  CIWA:  CIWA-Ar Total: 5 COWS:  COWS Total Score: 2  Psychiatric Specialty Exam: See Psychiatric Specialty Exam and  Suicide Risk Assessment completed by Attending Physician prior to discharge.  Discharge destination:  Home  Is patient on multiple antipsychotic therapies at discharge:  No   Has Patient had three or more failed trials of antipsychotic monotherapy by history:  No  Recommended Plan for Multiple Antipsychotic Therapies: NA     Medication List    TAKE these medications      Indication   busPIRone 5 MG tablet  Commonly known as:  BUSPAR  Take 1 tablet (5 mg total) by mouth 2 (two) times daily.   Indication:  Anxiety Disorder, Symptoms of Feeling Anxious     citalopram 20 MG tablet  Commonly known as:  CELEXA  Take 1 tablet (20 mg total) by mouth daily.   Indication:  Depression     traZODone 50 MG tablet  Commonly known as:  DESYREL  Take 1 tablet (50 mg total) by mouth at bedtime as needed for sleep.   Indication:  Trouble Sleeping       Follow-up Information    Follow up with [facility] On 03/13/2014.   Why:  [facility] [phone]) on Thursday, March 13, 2014 regarding bed availabilty.  Please call around [facility]8AM   Contact information:   [address] [place], [place]   [address]  [phone]      [facility].   Why:  Please go to [facility] walk in clinic on Wednesday, March 13, 2014(if not [facility]) or any weekday between 8AM -3PM [facility] for [facility] management/counseling.     Contact information:   [address] [place], [place]   [address]  [phone]      Follow up with [facility]    . Schedule an appointment as soon as possible for a visit today.   Why:  For follow up of positive Hepatitis C antibodies    Contact information:   [address] [place] [phone]     Follow-up recommendations:   Activity: As tolerated Diet: Regular Tests: NA Other: See below  Comments:   Take all your medications as  prescribed by your mental healthcare provider.  Report any adverse effects and or reactions from your medicines to your outpatient provider promptly.  Patient is instructed and cautioned to not engage in alcohol and or illegal drug use while on prescription medicines.  In the event of worsening symptoms, patient is instructed to call the crisis hotline, 911 and or go to the nearest ED for appropriate evaluation and treatment of symptoms.  Follow-up with your primary care provider for your other medical issues, concerns and or health care needs.   Total Discharge Time:  Greater than 30 minutes.  SignedFransisca Kaufmann: DAVIS, LAURA NP-C 03/12/2014, 2:18 PM   Patient seen, Suicide Assessment Completed.  Disposition Plan Reviewed

## 2014-03-12 NOTE — Progress Notes (Signed)
Patient ID: [name], male   DOB: 07/24/1990, 23 y.o.   MRN: [id number]  Pt. Denies SI/HI and A/V hallucinations.  Belongings returned to patient at time of discharge. Patient denies any pain or discomfort. Discharge instructions and medications were reviewed with patient. Patient verbalized understanding of both medications and discharge instructions. Q15 minute safety checks maintained until discharge. No distress noted upon discharge.

## 2014-03-12 NOTE — Plan of Care (Signed)
Problem: Alteration in mood; excessive anxiety as evidenced by: Goal: LTG-Patient's behavior demonstrates decreased anxiety Goal met. Patient is rating anxiety at three. Goal is for patient to rate anxiety at three or below on discharge.   Burnis Medin Yechiel Erny, LCSW 03/12/2014 10:17 AM    (Patient's behavior demonstrates anxiety and he/she is utilizing learned coping skills to deal with anxiety-producing situations)  Outcome: Completed/Met Date Met:  03/12/14

## 2014-03-12 NOTE — Progress Notes (Signed)
[facility] :  Will you be returning to the same living situation after discharge: Yes,  Patient is returning home with family. At discharge, do you have transportation home?:Yes,  Patient is able to arrange transportation home. Do you have the ability to pay for your medications:No.  Patient needs assistance with indigent medications   Release of information consent forms completed and in the chart;  Patient's signature needed at discharge.  Patient to Follow up at: Follow-up Information    Follow up with [facility] On 03/13/2014.   Why:  [facility] [phone]) on Thursday, March 13, 2014 regarding bed availabilty.  Please call around [facility]8AM   Contact information:   [address] [place], [place]   [address]  [phone]      [facility].   Why:  Please go to [facility] walk in clinic on Wednesday, March 13, 2014(if not [facility]) or any weekday between 8AM -3PM upon discharge [facility] for medication management/counseling.     Contact information:   [address] [place], [place]   [address]  [phone]      Patient denies SI/HI: Patient no longer endorsing SI/HI or other thoughts of self harm.   Safety Planning and Suicide Prevention discussed: .Reviewed with all patients during discharge planning group   [name], [name] 03/12/2014, 11:32 AM

## 2014-03-12 NOTE — [facility] Group Notes (Signed)
[facility] L[facility] Note   03/12/2014 10:15 AM    Participation Quality:  Appropraite  Mood/Affect:  Appropriate  Depression Rating:  2  Anxiety Rating:  3  Thoughts of Suicide:  No  Will you contract for safety?   NA  Current AVH:  No  Plan for Discharge/Comments:  Patient attended discharge planning group and actively participated in group. Patient is requesting [facility] either [facility] or [facility]. Patient has a place to live and transportation at discharge.  Suicide prevention education reviewed and SPE document provided.   Transportation Means: Patient has transportation.   Supports:  Patient has a support system.   Cianni Manny, Joesph JulyQuylle Hairston

## 2014-03-14 NOTE — Progress Notes (Signed)
Patient Discharge Instructions:  After Visit Summary (AVS):   Faxed to:  03/14/14 Discharge Summary Note:   Faxed to:  03/14/14 Psychiatric Admission Assessment Note:   Faxed to:  03/14/14 Suicide Risk Assessment - Discharge Assessment:   Faxed to:  03/14/14 Faxed/Sent to the Next Level Care provider:  03/14/14 Next Level Care Provider Has Access to the EMR, 03/14/14  Faxed to [facility] @ [phone] Faxed to [facility] @ [phone] Records provided to [facility] via CHL/Epic access.  Jerelene ReddenSheena E Herrings, 03/14/2014, 2:51 PM

## 2020-02-28 ENCOUNTER — Emergency Department (HOSPITAL_BASED_OUTPATIENT_CLINIC_OR_DEPARTMENT_OTHER)
Admission: EM | Admit: 2020-02-28 | Discharge: 2020-02-28 | Disposition: A | Discharge status: Home / Self Care | Payer: Self-pay | Attending: Emergency Medicine | Admitting: Emergency Medicine

## 2020-02-28 ENCOUNTER — Encounter (HOSPITAL_BASED_OUTPATIENT_CLINIC_OR_DEPARTMENT_OTHER): Payer: Self-pay | Admitting: *Deleted

## 2020-02-28 ENCOUNTER — Other Ambulatory Visit: Payer: Self-pay

## 2020-02-28 DIAGNOSIS — F1910 Other psychoactive substance abuse, uncomplicated: Secondary | ICD-10-CM | POA: Insufficient documentation

## 2020-02-28 DIAGNOSIS — F17210 Nicotine dependence, cigarettes, uncomplicated: Secondary | ICD-10-CM | POA: Insufficient documentation

## 2020-02-28 DIAGNOSIS — F419 Anxiety disorder, unspecified: Secondary | ICD-10-CM | POA: Insufficient documentation

## 2020-02-28 DIAGNOSIS — Z79899 Other long term (current) drug therapy: Secondary | ICD-10-CM | POA: Insufficient documentation

## 2020-02-28 NOTE — ED Notes (Signed)
ED Provider at bedside. 

## 2020-02-28 NOTE — ED Triage Notes (Addendum)
Pt reports taking 360 xanax tablets a month. Pt requesting to see a counselor

## 2020-02-28 NOTE — ED Provider Notes (Signed)
[facility] EMERGENCY DEPARTMENT Provider Note   CSN: [id number] Arrival date & time: 02/28/20  1503     History Chief Complaint  Patient presents with  . Anxiety    [place] Antolin is a 29 y.o. male.  HPI   Patient states that he takes 360 Xanax tablets a month. Patient states he has been buying them off the street. Patient states he went to his methadone clinic. They recommended that he follow-up at another medical facility regarding his Xanax use. Patient states he went to [place] [facility]. Patient states it sounds like he was during his intake paperwork and ended up briefly seeing the doctor. Patient was requesting a prescription for Xanax so he could stop abusing Xanax. The doctor told him that he would be able to see him on Monday but him today. Patient states he was then referred to this emergency room and was told that we would prescribe the Xanax for him.  Past Medical History:  Diagnosis Date  . Anxiety   . Depression   . Hepatitis C   . Substance abuse [facility])     Patient Active Problem List   Diagnosis Date Noted  . Opioid dependence with opioid-induced mood disorder (HCC)   . MDD (major depressive disorder), recurrent severe, without psychosis (HCC) 03/09/2014  . Suicidal ideation   . Elevated liver enzymes 01/26/2013  . Major depression 01/26/2013  . Panic attacks 01/07/2013  . PTSD (post-traumatic stress disorder) 01/07/2013  . Alcohol abuse 01/07/2013    Past Surgical History:  Procedure Laterality Date  . extraction of wisdom teeth  Age 87       Family History  Problem Relation Age of Onset  . Anxiety disorder Father   . Drug abuse Father   . Anxiety disorder Paternal Grandmother   . Bipolar disorder Paternal Aunt   . Alcohol abuse Paternal Aunt   . Anxiety disorder Paternal Uncle     Social History   Tobacco Use  . Smoking status: Current Every Day Smoker    Packs/day: 1.00    Types: Cigarettes  Substance Use Topics  . Alcohol  use: Yes    Alcohol/week: 13.0 - 16.0 standard drinks    Types: 6 Glasses of wine, 7 - 10 Cans of beer per week    Comment: 4 to 5 times a week (as of 08/28/13, pt reports a couple times a month)  . Drug use: Yes    Types: Marijuana, Cocaine, Methamphetamines    Home Medications Prior to Admission medications   Medication Sig Start Date End Date Taking? Authorizing Provider  busPIRone (BUSPAR) 5 MG tablet Take 1 tablet (5 mg total) by mouth 2 (two) times daily. 03/12/14   Thermon Leyland, NP  citalopram (CELEXA) 20 MG tablet Take 1 tablet (20 mg total) by mouth daily. 03/12/14   Thermon Leyland, NP  methadone (DOLOPHINE) 10 MG/5ML solution Take 280 mg by mouth.    [provider]  traZODone (DESYREL) 50 MG tablet Take 1 tablet (50 mg total) by mouth at bedtime as needed for sleep. 03/12/14   Thermon Leyland, NP    Allergies    Wellbutrin [bupropion]  Review of Systems   Review of Systems  All other systems reviewed and are negative.   Physical Exam Updated Vital Signs BP 117/76 (BP Location: Left Arm)   Pulse 75   Temp 98.4 F (36.9 C) (Oral)   Resp 18   Ht 1.829 m (6')   Wt 81.6 kg  SpO2 96%   BMI 24.41 kg/m   Physical Exam Vitals and nursing note reviewed.  Constitutional:      General: He is not in acute distress.    Appearance: He is well-developed.  HENT:     Head: Normocephalic and atraumatic.     Right Ear: External ear normal.     Left Ear: External ear normal.  Eyes:     General: No scleral icterus.       Right eye: No discharge.        Left eye: No discharge.     Conjunctiva/sclera: Conjunctivae normal.  Neck:     Trachea: No tracheal deviation.  Cardiovascular:     Rate and Rhythm: Normal rate and regular rhythm.  Pulmonary:     Effort: Pulmonary effort is normal. No respiratory distress.     Breath sounds: Normal breath sounds. No stridor. No wheezing or rales.  Abdominal:     General: Bowel sounds are normal. There is no distension.      Palpations: Abdomen is soft.     Tenderness: There is no abdominal tenderness. There is no guarding or rebound.  Musculoskeletal:        General: No tenderness.     Cervical back: Neck supple.  Skin:    General: Skin is warm and dry.     Findings: No rash.  Neurological:     Mental Status: He is alert.     Cranial Nerves: No cranial nerve deficit (no facial droop, extraocular movements intact, no slurred speech).     Sensory: No sensory deficit.     Motor: No abnormal muscle tone or seizure activity.     Coordination: Coordination normal.     ED Results / Procedures / Treatments   Labs (all labs ordered are listed, but only abnormal results are displayed) Labs Reviewed - No data to display  EKG None  Radiology No results found.  Procedures Procedures (including critical care time)  Medications Ordered in ED Medications - No data to display  ED Course  I have reviewed the triage vital signs and the nursing notes.  Pertinent labs & imaging results that were available during my care of the patient were reviewed by me and considered in my medical decision making (see chart for details).    MDM Rules/Calculators/A&P                          I explained to the patient that I would not be able to just give him a prescription for Xanax. Patient does not have any supporting documentation from the doctor's office. I did check the PDMP database and he does not have any valid prescriptions for Xanax. 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 Patient is not showing any signs of withdrawal. He is not tachycardic or tremulous. Patient ended up leaving before he was providing paperwork. Final Clinical Impression(s) / ED Diagnoses Final diagnoses:  Substance abuse [facility])    Rx / DC Orders ED Discharge Orders    None       Linwood Dibbles, MD 02/28/20 1901

## 2021-01-11 ENCOUNTER — Emergency Department (HOSPITAL_BASED_OUTPATIENT_CLINIC_OR_DEPARTMENT_OTHER): Payer: Self-pay

## 2021-01-11 ENCOUNTER — Emergency Department (HOSPITAL_BASED_OUTPATIENT_CLINIC_OR_DEPARTMENT_OTHER)
Admission: EM | Admit: 2021-01-11 | Discharge: 2021-01-11 | Disposition: A | Discharge status: Home / Self Care | Payer: Self-pay | Attending: Student | Admitting: Student

## 2021-01-11 ENCOUNTER — Encounter (HOSPITAL_BASED_OUTPATIENT_CLINIC_OR_DEPARTMENT_OTHER): Payer: Self-pay

## 2021-01-11 ENCOUNTER — Other Ambulatory Visit: Payer: Self-pay

## 2021-01-11 DIAGNOSIS — M7052 Other bursitis of knee, left knee: Secondary | ICD-10-CM

## 2021-01-11 DIAGNOSIS — M25462 Effusion, left knee: Secondary | ICD-10-CM | POA: Insufficient documentation

## 2021-01-11 DIAGNOSIS — Z87891 Personal history of nicotine dependence: Secondary | ICD-10-CM | POA: Insufficient documentation

## 2021-01-11 MED ORDER — NAPROXEN 500 MG PO TABS: 500.0000 mg | ORAL_TABLET | Freq: Two times a day (BID) | ORAL | 0 refills | Status: AC

## 2021-01-11 MED ORDER — OXYCODONE HCL 5 MG PO TABS: 5.0000 mg | ORAL_TABLET | ORAL | 0 refills | Status: DC | PRN

## 2021-01-11 MED ORDER — NAPROXEN 500 MG PO TABS
500.0000 mg | ORAL_TABLET | Freq: Two times a day (BID) | ORAL | 0 refills | Status: DC
Start: 2021-01-11 — End: 2021-01-11

## 2021-01-11 MED ORDER — ACETAMINOPHEN 500 MG PO TABS: 1000.0000 mg | ORAL_TABLET | Freq: Three times a day (TID) | ORAL | 0 refills | Status: DC | PRN

## 2021-01-11 MED ORDER — OXYCODONE HCL 5 MG PO TABS: 5.0000 mg | ORAL_TABLET | ORAL | 0 refills | Status: AC | PRN

## 2021-01-11 MED ORDER — ACETAMINOPHEN 500 MG PO TABS: 1000.0000 mg | ORAL_TABLET | Freq: Three times a day (TID) | ORAL | 0 refills | Status: AC | PRN

## 2021-01-11 MED ORDER — HYDROCODONE-ACETAMINOPHEN 5-325 MG PO TABS
1.0000 | ORAL_TABLET | Freq: Once | ORAL | Status: AC
  Administered 2021-01-11: 1 via ORAL
  Filled 2021-01-11: qty 1

## 2021-01-11 NOTE — ED Provider Notes (Signed)
[facility] EMERGENCY DEPARTMENT Provider Note   CSN: [id number] Arrival date & time: 01/11/21  1853     History No chief complaint on file.   [name] is a 30 y.o. male with PMH anxiety, depression, hepatitis C, opioid abuse on methadone who presents the emergency department for evaluation of left knee pain.  Patient states that last week he started to develop significant left knee pain worse with any movement and he is having trouble walking.  He states that he works at a [employer] and [employer].  He states that he has been taking 800 mg of ibuprofen with no relief.  Denies fever, chills, chest pain, shortness of breath, abdominal pain or any other systemic symptoms.  HPI     Past Medical History:  Diagnosis Date   Anxiety    Depression    Hepatitis C    Substance abuse (HCC)     Patient Active Problem List   Diagnosis Date Noted   Opioid dependence with opioid-induced mood disorder (HCC)    MDD (major depressive disorder), recurrent severe, without psychosis (HCC) 03/09/2014   Suicidal ideation    Elevated liver enzymes 01/26/2013   Major depression 01/26/2013   Panic attacks 01/07/2013   PTSD (post-traumatic stress disorder) 01/07/2013   Alcohol abuse 01/07/2013    Past Surgical History:  Procedure Laterality Date   extraction of wisdom teeth  Age 57       Family History  Problem Relation Age of Onset   Anxiety disorder Father    Drug abuse Father    Anxiety disorder Paternal Grandmother    Bipolar disorder Paternal Aunt    Alcohol abuse Paternal Aunt    Anxiety disorder Paternal Uncle     Social History   Tobacco Use   Smoking status: Former    Packs/day: 1.00    Types: Cigarettes  Vaping Use   Vaping Use: Never used  Substance Use Topics   Alcohol use: Not Currently    Alcohol/week: 13.0 - 16.0 standard drinks    Types: 6 Glasses of wine, 7 - 10 Cans of beer per week    Comment: 4 to 5 times a week  (as of 08/28/13, pt reports a couple times a month)   Drug use: Not Currently    Types: Marijuana, Cocaine, Methamphetamines    Home Medications Prior to Admission medications   Medication Sig Start Date End Date Taking? Authorizing Provider  acetaminophen (TYLENOL) 500 MG tablet Take 2 tablets (1,000 mg total) by mouth every 8 (eight) hours as needed for moderate pain. 01/11/21  Yes Maizie Garno, MD  naproxen (NAPROSYN) 500 MG tablet Take 1 tablet (500 mg total) by mouth 2 (two) times daily. 01/11/21  Yes Jamiesha Victoria, MD  oxyCODONE (ROXICODONE) 5 MG immediate release tablet Take 1 tablet (5 mg total) by mouth every 4 (four) hours as needed for up to 5 days for severe pain. 01/11/21 01/16/21 Yes Lulla Linville, MD  busPIRone (BUSPAR) 5 MG tablet Take 1 tablet (5 mg total) by mouth 2 (two) times daily. 03/12/14   Thermon Leyland, NP  citalopram (CELEXA) 20 MG tablet Take 1 tablet (20 mg total) by mouth daily. 03/12/14   Thermon Leyland, NP  methadone (DOLOPHINE) 10 MG/5ML solution Take 280 mg by mouth.    [provider]  traZODone (DESYREL) 50 MG tablet Take 1 tablet (50 mg total) by mouth at bedtime as needed for sleep. 03/12/14   Earlene Plater,  Gerome Apley, NP    Allergies    Wellbutrin [bupropion]  Review of Systems   Review of Systems  Constitutional:  Negative for chills and fever.  HENT:  Negative for ear pain and sore throat.   Eyes:  Negative for pain and visual disturbance.  Respiratory:  Negative for cough and shortness of breath.   Cardiovascular:  Negative for chest pain and palpitations.  Gastrointestinal:  Negative for abdominal pain and vomiting.  Genitourinary:  Negative for dysuria and hematuria.  Musculoskeletal:  Positive for arthralgias. Negative for back pain.  Skin:  Negative for color change and rash.  Neurological:  Negative for seizures and syncope.  All other systems reviewed and are negative.  Physical Exam Updated Vital Signs BP 128/88 (BP Location: Right  Arm)   Pulse (!) 108   Temp 98.3 F (36.8 C) (Oral)   Resp 18   Ht 6' (1.829 m)   Wt 93 kg   SpO2 95%   BMI 27.80 kg/m   Physical Exam Vitals and nursing note reviewed.  Constitutional:      Appearance: He is well-developed.  HENT:     Head: Normocephalic and atraumatic.  Eyes:     Conjunctiva/sclera: Conjunctivae normal.  Cardiovascular:     Rate and Rhythm: Normal rate and regular rhythm.     Heart sounds: No murmur heard. Pulmonary:     Effort: Pulmonary effort is normal. No respiratory distress.     Breath sounds: Normal breath sounds.  Abdominal:     Palpations: Abdomen is soft.     Tenderness: There is no abdominal tenderness.  Musculoskeletal:        General: Swelling and tenderness (Left knee) present.     Cervical back: Neck supple.  Skin:    General: Skin is warm and dry.  Neurological:     Mental Status: He is alert.    ED Results / Procedures / Treatments   Labs (all labs ordered are listed, but only abnormal results are displayed) Labs Reviewed - No data to display  EKG None  Radiology DG Knee 2 Views Left  Result Date: 01/11/2021 CLINICAL DATA:  Pain rule out fracture EXAM: LEFT KNEE - 1-2 VIEW COMPARISON:  None. FINDINGS: No evidence of fracture, dislocation, or joint effusion. No evidence of arthropathy or other focal bone abnormality. Soft tissues are unremarkable. IMPRESSION: Negative. Electronically Signed   By: Jasmine Pang M.D.   On: 01/11/2021 22:47    Procedures Procedures   Medications Ordered in ED Medications  HYDROcodone-acetaminophen (NORCO/VICODIN) 5-325 MG per tablet 1 tablet (1 tablet Oral Given 01/11/21 2312)    ED Course  I have reviewed the triage vital signs and the nursing notes.  Pertinent labs & imaging results that were available during my care of the patient were reviewed by me and considered in my medical decision making (see chart for details).    MDM Rules/Calculators/A&P                           Patient  seen emergency department for evaluation of left knee pain.  Physical exam reveals swelling and tenderness over the anterior patellar bursa on the left.  Patient is able to bear weight on the knee and has range of motion but is limited by pain.  X-ray unremarkable.  Patient presentation consistent with prepatellar bursitis and an outpatient orthopedic referral was placed.  Crutches were given the patient he was instructed to place ice on  the knee and use a combination of Naprosyn and Tylenol.  He was given strict return precautions of which he voiced understanding and he was discharged.  Of note, a very short prescription for oxycodone for breakthrough pain was prescribed the patient and we had a long discussion about the risks of this medication in the setting of being on active methadone.  The patient has not been able to sleep for more than a week now and he does need appropriate pain control and thus we will prescribe this medication and his brother who is in the room will supervise and administer this medication as needed.  Patient then discharged. Final Clinical Impression(s) / ED Diagnoses Final diagnoses:  None    Rx / DC Orders ED Discharge Orders          Ordered    oxyCODONE (ROXICODONE) 5 MG immediate release tablet  Every 4 hours PRN        01/11/21 2311    naproxen (NAPROSYN) 500 MG tablet  2 times daily        01/11/21 2311    acetaminophen (TYLENOL) 500 MG tablet  Every 8 hours PRN        01/11/21 2311             Avea Mcgowen, Wyn Forster, MD 01/11/21 2317
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
## 2021-01-11 NOTE — Discharge Instructions (Addendum)
You were seen in the emergency department for evaluation of knee pain.  Your knee pain is likely a result of prepatellar bursitis.  It is very important that you stay off the knee for the next week and do not engage in heavy lifting as the knee needs to rest.  Please ice the knee frequently, take 1000 mg of Tylenol with breakfast lunch and dinner (3 times daily), and Naprosyn 500 mg twice daily.  A very short prescription for oxycodone will be provided for breakthrough pain only.  This is a high risk medication in the setting of your methadone use and it is important to only use this if no other medications are working.  Please return the emergency department if you notice worsening swelling, redness or complete inability to move the knee.  Please return if you experience fevers or vomiting.  Please call the number above for orthopedic follow-up and at this time you are safe for discharge.

## 2022-06-23 IMAGING — DX DG KNEE 1-2V*L*
2 series · 2 of 2 positions shown · non-contrast
Comparison: None.

CLINICAL DATA: Pain rule out fracture

EXAM:
LEFT KNEE - 1-2 VIEW

[knee ap]
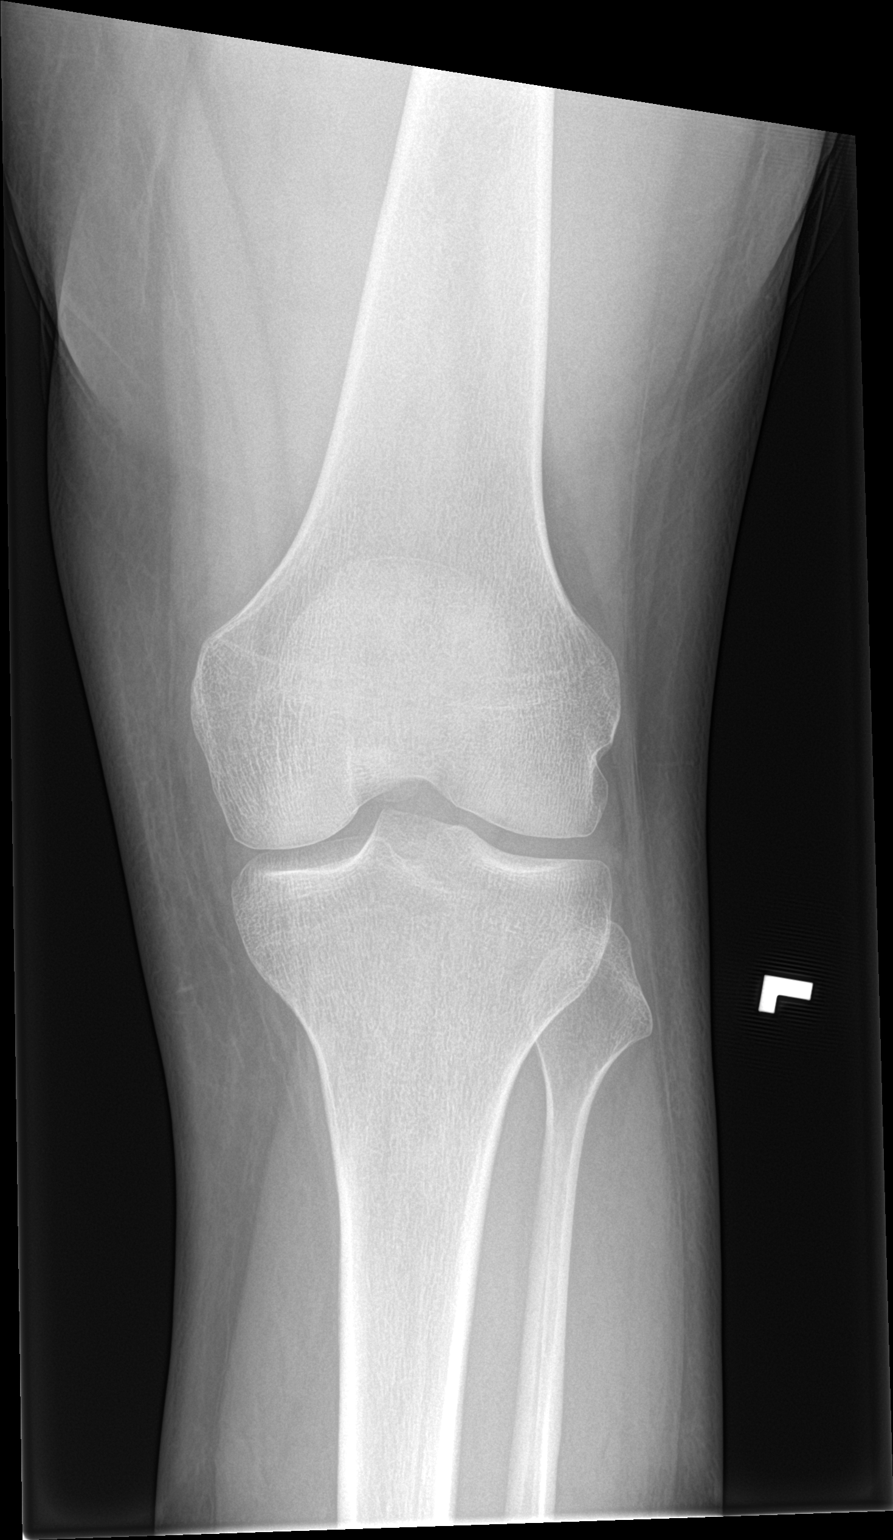

[knee lat]
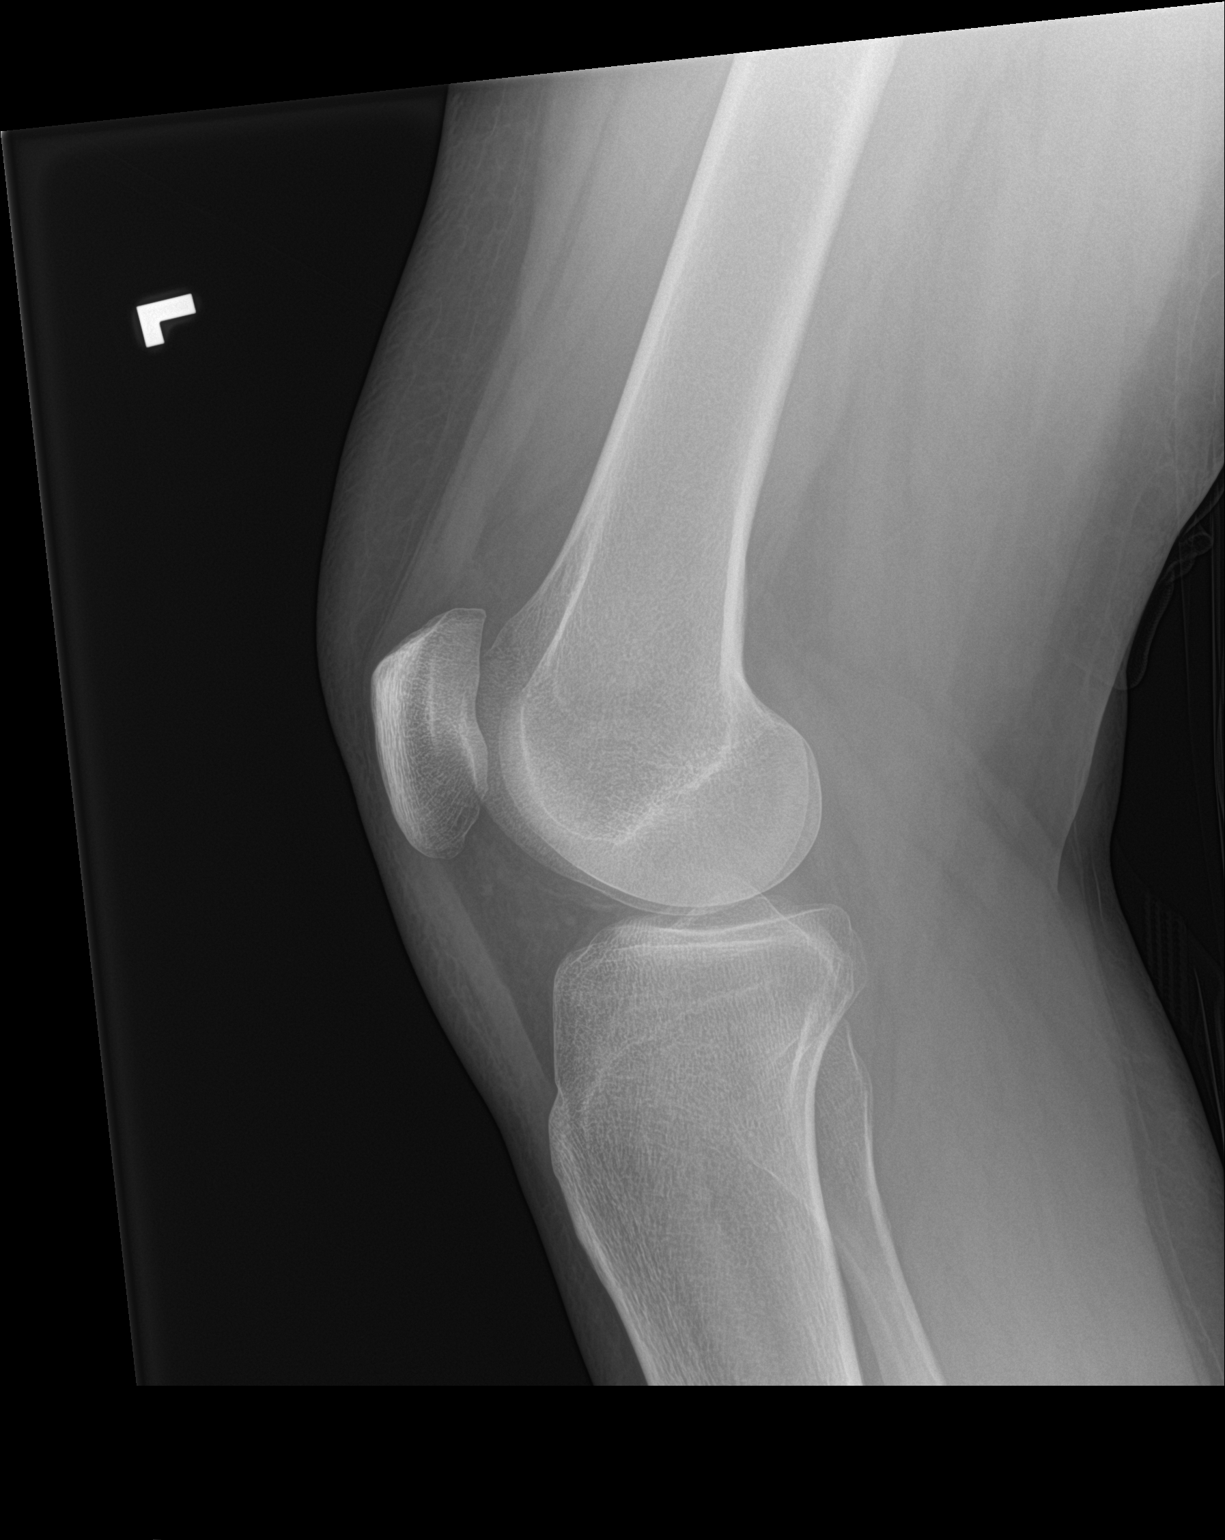

[2 of 2 positions shown; findings below may reference images not displayed]

FINDINGS: No evidence of fracture, dislocation, or joint effusion. No evidence
of arthropathy or other focal bone abnormality. Soft tissues are
unremarkable.
IMPRESSION: Negative.
